# Patient Record
Sex: Female | Born: 2000
Health system: Southern US, Community
[De-identification: ages and names within clinical notes are randomized; demographics above are authoritative.]

## PROBLEM LIST (undated history)

## (undated) DIAGNOSIS — D229 Melanocytic nevi, unspecified: Secondary | ICD-10-CM

---
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
## 2021-01-05 ENCOUNTER — Encounter: Payer: Self-pay | Admitting: Family Medicine

## 2021-01-06 ENCOUNTER — Telehealth: Payer: Self-pay | Admitting: Family Medicine

## 2021-01-06 NOTE — Telephone Encounter (Signed)
I already called, yes

## 2021-01-06 NOTE — Telephone Encounter (Signed)
Spoke with patient and clarified that there is no issue of putting IUD in during her cycle, in fact they used to put them in only during cycles in the past

## 2021-01-07 ENCOUNTER — Other Ambulatory Visit: Payer: Self-pay

## 2021-01-07 ENCOUNTER — Encounter: Payer: Self-pay | Admitting: Family Medicine

## 2021-01-07 ENCOUNTER — Ambulatory Visit (INDEPENDENT_AMBULATORY_CARE_PROVIDER_SITE_OTHER): Payer: No Typology Code available for payment source | Admitting: Family Medicine

## 2021-01-07 VITALS — BP 128/80 | HR 88 | Ht 62.5 in | Wt 216.0 lb

## 2021-01-07 DIAGNOSIS — Z30430 Encounter for insertion of intrauterine contraceptive device: Secondary | ICD-10-CM | POA: Diagnosis not present

## 2021-01-07 LAB — PREGNANCY, URINE: Preg Test, Ur: NEGATIVE

## 2021-01-07 MED ORDER — PARAGARD INTRAUTERINE COPPER IU IUD: 1.0000 | INTRAUTERINE_SYSTEM | Freq: Once | INTRAUTERINE | Status: AC

## 2021-01-07 NOTE — Progress Notes (Signed)
BP 128/80   Pulse 88   Ht 5' 2.5" (1.588 m)   Wt 216 lb (98 kg)   SpO2 100%   BMI 38.88 kg/m    Subjective:   Patient ID: Donna Walton, female    DOB: [birth year], 20 y.o.   MRN: [id number]  HPI: Donna Walton is a 20 y.o. female presenting on 01/07/2021 for contraceptive management and Abdominal Pain   HPI Birth control counseling and IUD.  Patient comes in today and she wants nonhormonal IUD, explained positive benefits and the possible side effects of the ParaGard and she would like to still go ahead with that.  Relevant past medical, surgical, family and social history reviewed and updated as indicated. Interim medical history since our last visit reviewed. Allergies and medications reviewed and updated.  Review of Systems  Constitutional:  Negative for chills and fever.  Eyes:  Negative for visual disturbance.  Respiratory:  Negative for chest tightness and shortness of breath.   Cardiovascular:  Negative for chest pain and leg swelling.  Genitourinary:  Negative for menstrual problem.  Psychiatric/Behavioral:  Negative for agitation and behavioral problems.   All other systems reviewed and are negative.  Per HPI unless specifically indicated above   Allergies as of 01/07/2021   No Known Allergies      Medication List        Accurate as of January 07, 2021  9:33 AM. If you have any questions, ask your nurse or doctor.          cefdinir 300 MG capsule Commonly known as: OMNICEF Take 1 capsule (300 mg total) by mouth 2 (two) times daily.         Objective:   BP 128/80   Pulse 88   Ht 5' 2.5" (1.588 m)   Wt 216 lb (98 kg)   SpO2 100%   BMI 38.88 kg/m   Wt Readings from Last 3 Encounters:  01/07/21 216 lb (98 kg) (98 %, Z= 2.16)*  12/09/20 210 lb (95.3 kg) (98 %, Z= 2.09)*  09/05/19 190 lb 9.6 oz (86.5 kg) (97 %, Z= 1.85)*   * Growth percentiles are based on CDC (Girls, 2-20 Years) data.    Physical Exam Vitals and nursing note reviewed.  Exam conducted with a chaperone present.  Constitutional:      Appearance: She is well-developed.  Genitourinary:    General: Normal vulva.     Exam position: Lithotomy position.     Labia:        Right: No rash or tenderness.        Left: No rash or tenderness.      Vagina: Normal.     Cervix: Dilated. Cervical bleeding present. No cervical motion tenderness or erythema.     Uterus: Normal.      Adnexa: Right adnexa normal and left adnexa normal.  Neurological:     Mental Status: She is alert.     IUD insertion procedure: Patient was placed in stirrups and use a speculum. Patient was prepped with Betadine swabs using cotton balls. Tenaculum was used to grab the anterior cervix. Uterine sound was performed and found to be 7cm in length and anteverted. Cervical dilation was not necessary as she is on her cycle.  ParaGard was placed using factory device at the correct depth that was measured and deployed without issue. The strings were cut leaving 1.5 cm extra. Patient tolerated procedure well and bleeding was minimal.  Assessment & Plan:   Problem List  Items Addressed This Visit   None Visit Diagnoses     Encounter for insertion of intrauterine contraceptive device (IUD)    -  Primary   Relevant Medications   paragard intrauterine copper IUD 1 each (Start on 01/07/2021  9:45 AM)   Other Relevant Orders   POCT urine pregnancy        Follow up plan: Return if symptoms worsen or fail to improve, for 5-6 -week IUD recheck.  Counseling provided for all of the vaccine components Orders Placed This Encounter  Procedures   POCT urine pregnancy    Caryl Pina, MD [facility] 01/07/2021, 9:33 AM

## 2021-01-07 NOTE — Patient Instructions (Signed)
Intrauterine Copper Contraceptive What is this medication? INTRAUTERINE COPPER (IN truh Kendra Opitz ryn KOP er) prevents pregnancy. It belongs to a group of medications known as contraceptives. This medicine may be used for other purposes; ask your health care provider or pharmacist if you have questions. COMMON BRAND NAME(S): ParaGard T380A What should I tell my care team before I take this medication? They need to know if you have any of these conditions: Abnormal Pap smear Cancer of the breast, uterus, or cervix Endometritis Genital or pelvic infection now or in the past Have more than one sexual partner or your partner has more than one partner History of an ectopic or tubal pregnancy Immune system problems IUD in place Use intravenous drugs Uterus of unusual shape Vaginal bleeding that has not been explained Wilson's disease An unusual or allergic reaction to copper, barium sulfate, or polyethylene, other medications, foods, dyes, or preservatives Pregnant or trying to get pregnant Breast-feeding How should I use this medication? This device is placed inside the uterus by your care team. A patient package insert for the product will be given each time it is inserted. Be sure to read this information carefully each time. The sheet may change often. Talk to your care team about use of this medication in children. Special care may be needed. Overdosage: If you think you have taken too much of this medicine contact a poison control center or emergency room at once. NOTE: This medicine is only for you. Do not share this medicine with others. What if I miss a dose? This does not apply. The device will need to be replaced every 10 years if you wish to continue using this type of birth control. What may interact with this medication? Interactions are not expected. Tell your care team about all the medications you take. This list may not describe all possible interactions. Give your health  care provider a list of all the medicines, herbs, non-prescription drugs, or dietary supplements you use. Also tell them if you smoke, drink alcohol, or use illegal drugs. Some items may interact with your medicine. What should I watch for while using this medication? Visit your care team for regular check-ups. Tell your care team if you or your partner becomes HIV positive or gets a sexually transmitted disease. If you have any reason to think you are pregnant, stop using this medication right away and contact your care team. This medication does not protect you against HIV infection (AIDS) or any other sexually transmitted diseases. You can check the placement of the IUD yourself by reaching up to the top of your vagina with clean fingers to feel the threads. Do not pull on the threads. It is a good habit to check placement after each menstrual period. Call your care team right away if you feel more of the IUD than just the threads or if you cannot feel the threads at all. The IUD may come out by itself. You may become pregnant if the device comes out. If you notice that the IUD has come out use a backup birth control method like condoms and call your care team. Using tampons will not change the position of the IUD and are okay to use during your period. This IUD can be safely scanned with magnetic resonance imaging (MRI) only under specific conditions. Before you have an MRI, tell your care team that you have an IUD in place, and which type of IUD you have in place. What side effects may I  notice from receiving this medication? Side effects that you should report to your care team as soon as possible: Allergic reactions--skin rash, itching, hives, swelling of the face, lips, tongue, or throat Heavy vaginal bleeding Low red blood cell counts--trouble breathing; feeling faint; lightheaded, falls; unusually weak or tired Pain during sex Pelvic inflammatory disease (PID)--fever, abdominal pain, pelvic  pain, pain or trouble passing urine, spotting, bleeding during or after sex Unusual vaginal discharge, itching, or odor Vaginal pain, irritation, or sores Side effects that usually do not require medical attention (report these to your care team if they continue or are bothersome): Back pain Irregular menstrual cycles or spotting Menstrual cramps Muscle cramps, pain This list may not describe all possible side effects. Call your doctor for medical advice about side effects. You may report side effects to FDA at 1-800-FDA-1088. Where should I keep my medication? This does not apply. NOTE: This sheet is a summary. It may not cover all possible information. If you have questions about this medicine, talk to your doctor, pharmacist, or health care provider.  2022 Elsevier/Gold Standard (2020-07-29 00:00:00)

## 2021-02-24 ENCOUNTER — Ambulatory Visit: Payer: No Typology Code available for payment source | Admitting: Family Medicine

## 2021-04-16 ENCOUNTER — Ambulatory Visit (INDEPENDENT_AMBULATORY_CARE_PROVIDER_SITE_OTHER): Payer: No Typology Code available for payment source | Admitting: Family Medicine

## 2021-04-16 VITALS — BP 137/81 | HR 95 | Temp 97.2°F | Ht 62.5 in | Wt 223.4 lb

## 2021-04-16 DIAGNOSIS — Z30431 Encounter for routine checking of intrauterine contraceptive device: Secondary | ICD-10-CM

## 2021-04-16 DIAGNOSIS — R110 Nausea: Secondary | ICD-10-CM

## 2021-04-16 NOTE — Progress Notes (Signed)
Subjective: CC:IUD check PCP: Janora Norlander, DO WCH:ENIDPO Oakey is a 21 y.o. female presenting to clinic today for:  1. IUD check Reports menses are regular and she really likes the IUD. No pelvic pain. Periods slightly heavier.  She reports random waves of nausea that can occur any time of the month. No GERD symptoms. Tried cleaning up her diet but no real change.   ROS: Per HPI  No Known Allergies Past Medical History:  Diagnosis Date   Atypical nevus 05/25/2004   Top Scalp-Slight (Dr. Waldon Merl)   Atypical nevus 09/29/2008   Mid Vertex of Scalp-Moderate (Dr. Waldon Merl)   Atypical nevus 09/20/2012   Left Scalp-Moderate   Atypical nevus-Melanocytic with unusual features 05/25/2004   Left Buttocks, Perirectal (Exc) (Dr. Waldon Merl)    Current Outpatient Medications:    cefdinir (OMNICEF) 300 MG capsule, Take 1 capsule (300 mg total) by mouth 2 (two) times daily., Disp: 14 capsule, Rfl: 0  Current Facility-Administered Medications:    paragard intrauterine copper IUD 1 each, 1 each, Intrauterine, Once, Dettinger, Fransisca Kaufmann, MD Social History   Socioeconomic History   Marital status: Single    Spouse name: Not on file   Number of children: Not on file   Years of education: Not on file   Highest education level: Not on file  Occupational History   Not on file  Tobacco Use   Smoking status: Never   Smokeless tobacco: Never  Vaping Use   Vaping Use: Never used  Substance and Sexual Activity   Alcohol use: No   Drug use: No   Sexual activity: Never  Other Topics Concern   Not on file  Social History Narrative   Not on file   Social Determinants of Health   Financial Resource Strain: Not on file  Food Insecurity: Not on file  Transportation Needs: Not on file  Physical Activity: Not on file  Stress: Not on file  Social Connections: Not on file  Intimate Partner Violence: Not on file   Family History  Problem Relation Age of Onset   Hypertension Father     Hyperlipidemia Father    Bladder Cancer Father    Hypertension Maternal Grandmother    Hypertension Maternal Grandfather    Hyperlipidemia Maternal Grandfather    Diabetes Paternal Grandmother    CAD Paternal Grandmother    Uterine cancer Paternal Grandmother    Heart attack Paternal Grandfather     Objective: Office vital signs reviewed. BP 137/81    Pulse 95    Temp (!) 97.2 F (36.2 C)    Ht 5' 2.5" (1.588 m)    Wt 223 lb 6.4 oz (101.3 kg)    LMP 03/16/2021    SpO2 99%    BMI 40.21 kg/m   Physical Examination:  General: Awake, alert, obese, well nourished, No acute distress HEENT: sclera white, MMM GU: external vaginal tissue normal, cervix high and retroverted, no punctate lesions on cervix appreciated, mild clear/ beige discharge from cervical os, no bleeding, no cervical motion tenderness, no abdominal/ adnexal masses  Assessment/ Plan: 21 y.o. female   IUD check up  Nausea  Strings visible. Mild discharge but seems physiologic. Asymptomatic. Approaching menses.  Uncertain etiology of nausea.  Still having menstrual cycles regularly so doubt pregnancy.  ?GERD. Asked her to keep a diary.  This is not in the literature as a side effect of paragard but will cc Dr Dettinger for any additional input.  No orders of the defined types were  placed in this encounter.  No orders of the defined types were placed in this encounter.    Janora Norlander, DO [place] [phone]

## 2021-08-02 ENCOUNTER — Ambulatory Visit (INDEPENDENT_AMBULATORY_CARE_PROVIDER_SITE_OTHER): Payer: No Typology Code available for payment source | Admitting: Family Medicine

## 2021-08-02 ENCOUNTER — Encounter: Payer: Self-pay | Admitting: Family Medicine

## 2021-08-02 DIAGNOSIS — I10 Essential (primary) hypertension: Secondary | ICD-10-CM | POA: Diagnosis not present

## 2021-08-02 MED ORDER — SAXENDA 18 MG/3ML ~~LOC~~ SOPN
PEN_INJECTOR | SUBCUTANEOUS | 3 refills | Status: DC
  Filled 2021-08-04: qty 15, 30d supply, fill #0
  Filled 2021-08-30: qty 15, 30d supply, fill #1
  Filled 2021-10-05: qty 15, 30d supply, fill #2

## 2021-08-02 MED ORDER — NOVOFINE PLUS PEN NEEDLE 32G X 4 MM MISC
3 refills | Status: DC
  Filled 2021-08-04: qty 100, 90d supply, fill #0

## 2021-08-02 NOTE — Progress Notes (Signed)
Subjective: CC: Initial weight management appointment HPI: Donna Walton is a 21 y.o. female that presents today for initial weight loss visit.  They note that obesity does run in the family.  Maximum weight: 230.  Patient wishes to achieve the goal by: weight loss medication, maybe phentermine.  Medications tried in the past: none.  Side effects: n/a.  Diet is tried in the past: intermittent fasting, reduced caloric intake and increased physical exercise but she is not currently exercising nor following a strict diet just low in bread and zero sugar sodas.  Patient has never seen a dietitian in the past.  Patient's occupation: Corporate treasurer.  Denies Apnea, frequent nighttime awakenings, poor energy, snoring.   Possible weight altering medications: Negative for Atypical antipsychotics/ antidepressents, oral corticosteroids, Depo-Provera, insulin  Past medical history Negative for thyroid disease (including thyroid cancer), MEN, pancreatitis, diabetes, high blood pressure, high cholesterol, heart attack, stroke. Negative for Mental health disorders including addiction, depression, anxiety, bipolar disorder, OCD.  Family history Negative for thyroid disease (including thyroid cancer), MEN, pancreatitis Positive for diabetes, high blood pressure, high cholesterol   Past Medical History:  Diagnosis Date   Atypical nevus 05/25/2004   Top Scalp-Slight (Dr. Waldon Merl)   Atypical nevus 09/29/2008   Mid Vertex of Scalp-Moderate (Dr. Waldon Merl)   Atypical nevus 09/20/2012   Left Scalp-Moderate   Atypical nevus-Melanocytic with unusual features 05/25/2004   Left Buttocks, Perirectal (Exc) (Dr. Waldon Merl)   No current outpatient medications on file.  Current Facility-Administered Medications:    paragard intrauterine copper IUD 1 each, 1 each, Intrauterine, Once, Dettinger, Fransisca Kaufmann, MD Social History   Socioeconomic History   Marital status: Single    Spouse name: Not on file   Number of  children: Not on file   Years of education: Not on file   Highest education level: Not on file  Occupational History   Not on file  Tobacco Use   Smoking status: Never   Smokeless tobacco: Never  Vaping Use   Vaping Use: Never used  Substance and Sexual Activity   Alcohol use: No   Drug use: No   Sexual activity: Never  Other Topics Concern   Not on file  Social History Narrative   Not on file   Social Determinants of Health   Financial Resource Strain: Not on file  Food Insecurity: Not on file  Transportation Needs: Not on file  Physical Activity: Not on file  Stress: Not on file  Social Connections: Not on file  Intimate Partner Violence: Not on file   No Known Allergies Family History  Problem Relation Age of Onset   Hypertension Father    Hyperlipidemia Father    Bladder Cancer Father    Hypertension Maternal Grandmother    Hypertension Maternal Grandfather    Hyperlipidemia Maternal Grandfather    Diabetes Paternal Grandmother    CAD Paternal Grandmother    Uterine cancer Paternal Grandmother    Heart attack Paternal Grandfather     ROS: Per HPI  Objective: Office vital signs reviewed. BP 138/80   Pulse 77   Temp 97.6 F (36.4 C)   Ht '5\' 2"'$  (1.575 m)   Wt 222 lb 3.2 oz (100.8 kg)   LMP 07/26/2021   SpO2 100%   BMI 40.64 kg/m   Physical Examination:  General: Awake, alert, morbidly obese, well-appearing female, No acute distress HEENT: Normal    Neck: No masses palpated.  No thyromegaly    Eyes: PERRLA, extraocular movement in tact,  sclera white; no exophthalmos     Throat: moist mucus membranes, Airway is patent Cardio: regular rate and rhythm, S1S2 heard, no murmurs appreciated Pulm: clear to auscultation bilaterally, no wheezes, rhonchi or rales; normal work of breathing on room air Extremities: warm, well perfused, No edema, cyanosis or clubbing; +2 pulses bilaterally MSK: normal gait and station Skin: dry; intact; no rashes or lesions; NO  acanthosis nigricans observed Psych: Mood stable, speech normal, affect appropriate, good eye contact     08/02/2021   10:06 AM 04/16/2021    3:47 PM 01/07/2021    9:05 AM  Depression screen PHQ 2/9  Decreased Interest 0 0 0  Down, Depressed, Hopeless 0 0 0  PHQ - 2 Score 0 0 0  Altered sleeping   0  Tired, decreased energy   0  Change in appetite   0  Feeling bad or failure about yourself    0  Trouble concentrating   0  Moving slowly or fidgety/restless   0  Suicidal thoughts   0  PHQ-9 Score   0       08/02/2021   10:06 AM 04/16/2021    3:47 PM 01/07/2021    9:05 AM 12/09/2020   10:13 AM  GAD 7 : Generalized Anxiety Score  Nervous, Anxious, on Edge 0 0 1 1  Control/stop worrying 0 0 0 0  Worry too much - different things 0 0 0 0  Trouble relaxing 0 0 0 0  Restless 0 0 0 0  Easily annoyed or irritable 0 0 1 1  Afraid - awful might happen 0 0 0 0  Total GAD 7 Score 0 0 2 2  Anxiety Difficulty Not difficult at all Not difficult at all  Not difficult at all   Assessment/ Plan: 21 y.o. femalehere for initial weight management visit.  Morbid obesity ([place]) - Plan: Liraglutide -Weight Management (SAXENDA) 18 MG/3ML SOPN, Insulin Pen Needle (NOVOFINE PLUS PEN NEEDLE) 32G X 4 MM MISC  Essential hypertension - Plan: Liraglutide -Weight Management (SAXENDA) 18 MG/3ML SOPN, Insulin Pen Needle (NOVOFINE PLUS PEN NEEDLE) 32G X 4 MM MISC  BMI greater than 40 with multiple risk factors for chronic disease associated with morbid obesity.  Her blood pressure today is consistent with a diet-controlled hypertension.  I have recommended GLP for treatment of obesity.  We discussed potential side effects and risks of medication but there are no apparent contraindications to use.  Plan is to get her up to 3 mg and then transition her over to 1.7 mg of the [facility] weekly.  She is open to dietitian referral.  We may consider placing this.  I would like to see her back in 3 months for weight  recheck  [place], [place] [phone]

## 2021-08-02 NOTE — Patient Instructions (Signed)
Tips for success with Saxenda (and by success, how not to be super sick on your stomach): Eat small meals AVOID heavy foods (fried/ high in carbs like bread, pasta, rice) AVOID carbonated beverages (soda/ beer, as these can increase bloating) DOUBLE your water intake (will help you avoid constipation/ dehydration)  Saxenda CAN cause: Nausea Abdominal pain Increased acid reflux (sometimes presents as "sour burps") Constipation OR Diarrhea Fatigue (especially when you first start it)

## 2021-08-03 ENCOUNTER — Other Ambulatory Visit (HOSPITAL_COMMUNITY): Payer: Self-pay

## 2021-08-04 ENCOUNTER — Other Ambulatory Visit (HOSPITAL_COMMUNITY): Payer: Self-pay

## 2021-08-04 ENCOUNTER — Telehealth: Payer: Self-pay | Admitting: *Deleted

## 2021-08-04 NOTE — Telephone Encounter (Signed)
Donna Walton (Donna Walton) Rx #: [id number] Saxenda '18MG'$ Fayne Mediate pen-injectors  Sent to plan

## 2021-08-04 NOTE — Telephone Encounter (Signed)
Your prior authorization for Donna Walton has been approved! MORE INFO For eligible patients, copay assistance may be available. To learn more and be redirected to the [facility] website, click on the "More Info" button to the right. Please also note that you may need to schedule a follow-up visit with your patient prior to the expiration of this prior authorization, as updated patient weight may be required for reauthorization.  Message from plan: The request has been approved. The authorization is effective for a maximum of 4 fills from 08/04/2021 to 12/02/2021, as long as the member is enrolled in their current health plan. The request was approved as submitted. This request has been approved for 35m per 30 days.Renewal for Saxenda requires that the patient has achieved or maintained at least 4 percent weight loss of baseline body weight after 4 months of treatment.Please note: This medication must be filled by designated specialty pharmacy. Call [phone] additional information. A written notification letter will follow with additional details.   Pharm aware -[facility]

## 2021-08-05 ENCOUNTER — Other Ambulatory Visit (HOSPITAL_COMMUNITY): Payer: Self-pay

## 2021-08-30 ENCOUNTER — Other Ambulatory Visit (HOSPITAL_COMMUNITY): Payer: Self-pay

## 2021-08-31 ENCOUNTER — Ambulatory Visit (INDEPENDENT_AMBULATORY_CARE_PROVIDER_SITE_OTHER): Payer: No Typology Code available for payment source | Admitting: Physician Assistant

## 2021-08-31 ENCOUNTER — Encounter: Payer: Self-pay | Admitting: Physician Assistant

## 2021-08-31 DIAGNOSIS — Z1283 Encounter for screening for malignant neoplasm of skin: Secondary | ICD-10-CM | POA: Diagnosis not present

## 2021-08-31 DIAGNOSIS — Z86018 Personal history of other benign neoplasm: Secondary | ICD-10-CM | POA: Diagnosis not present

## 2021-08-31 NOTE — Progress Notes (Signed)
   Follow-Up Visit   Subjective  Donna Walton is a 21 y.o. female who presents for the following: Annual Exam (Full body skin exam with personal history of atypical moles slight to moderate.).   The following portions of the chart were reviewed this encounter and updated as appropriate:  Tobacco  Allergies  Meds  Problems  Med Hx  Surg Hx  Fam Hx      Objective  Well appearing patient in no apparent distress; mood and affect are within normal limits.  A full examination was performed including scalp, head, eyes, ears, nose, lips, neck, chest, axillae, abdomen, back, buttocks, bilateral upper extremities, bilateral lower extremities, hands, feet, fingers, toes, fingernails, and toenails. All findings within normal limits unless otherwise noted below.  No atypical nevi or signs of NMSC noted at the time of the visit.    Assessment & Plan  Screening exam for skin cancer  Yearly skin checks due to history of atypia.    I, Shammara Jarrett, PA-C, have reviewed all documentation's for this visit.  The documentation on 08/31/21 for the exam, diagnosis, procedures and orders are all accurate and complete.

## 2021-09-29 ENCOUNTER — Telehealth: Payer: Self-pay | Admitting: Family Medicine

## 2021-09-29 NOTE — Telephone Encounter (Signed)
Ok to schedule video visit for this med if needed but will need an appt for a controlled substance.  Med can only be given for 1 month so will not be a reliable antiobesity med long term.  FYI

## 2021-09-29 NOTE — Telephone Encounter (Signed)
lmtcb

## 2021-09-30 NOTE — Telephone Encounter (Signed)
PT R/C 

## 2021-10-01 ENCOUNTER — Encounter: Payer: Self-pay | Admitting: Family Medicine

## 2021-10-01 ENCOUNTER — Other Ambulatory Visit: Payer: Self-pay | Admitting: Family Medicine

## 2021-10-01 DIAGNOSIS — Z111 Encounter for screening for respiratory tuberculosis: Secondary | ICD-10-CM

## 2021-10-01 NOTE — Telephone Encounter (Signed)
Pt scheduled for video

## 2021-10-01 NOTE — Progress Notes (Unsigned)
Orders Placed This Encounter  Procedures   QuantiFERON-TB Gold Plus    Standing Status:   Future    Standing Expiration Date:   10/02/2022

## 2021-10-04 ENCOUNTER — Ambulatory Visit (INDEPENDENT_AMBULATORY_CARE_PROVIDER_SITE_OTHER): Payer: No Typology Code available for payment source

## 2021-10-04 DIAGNOSIS — Z111 Encounter for screening for respiratory tuberculosis: Secondary | ICD-10-CM

## 2021-10-04 NOTE — Progress Notes (Signed)
TB skin test placed to left forearm, patient tolerated well.  Appointment scheduled on 10/06/21 for patient to return to have test read.

## 2021-10-06 ENCOUNTER — Other Ambulatory Visit (HOSPITAL_COMMUNITY): Payer: Self-pay

## 2021-10-06 ENCOUNTER — Ambulatory Visit: Payer: No Typology Code available for payment source | Admitting: *Deleted

## 2021-10-06 DIAGNOSIS — Z111 Encounter for screening for respiratory tuberculosis: Secondary | ICD-10-CM

## 2021-10-06 LAB — TB SKIN TEST
Induration: 0 mm
TB Skin Test: NEGATIVE

## 2021-10-06 NOTE — Progress Notes (Signed)
PPD skin test negative.

## 2021-10-08 ENCOUNTER — Telehealth (INDEPENDENT_AMBULATORY_CARE_PROVIDER_SITE_OTHER): Payer: No Typology Code available for payment source | Admitting: Family Medicine

## 2021-10-08 ENCOUNTER — Encounter: Payer: Self-pay | Admitting: Family Medicine

## 2021-10-08 DIAGNOSIS — Z6837 Body mass index (BMI) 37.0-37.9, adult: Secondary | ICD-10-CM

## 2021-10-08 DIAGNOSIS — E6609 Other obesity due to excess calories: Secondary | ICD-10-CM | POA: Diagnosis not present

## 2021-10-08 MED ORDER — PHENTERMINE HCL 37.5 MG PO TABS: 18.7500 mg | ORAL_TABLET | Freq: Every day | ORAL | 0 refills | Status: DC

## 2021-10-08 NOTE — Progress Notes (Signed)
MyChart Video visit  Subjective: CC: Follow-up obesity PCP: Janora Norlander, DO MWN:UUVOZD Matusek is a 21 y.o. female. Patient provides verbal consent for consult held via video.  Due to COVID-19 pandemic this visit was conducted virtually. This visit type was conducted due to national recommendations for restrictions regarding the COVID-19 Pandemic (e.g. social distancing, sheltering in place) in an effort to limit this patient's exposure and mitigate transmission in our community. All issues noted in this document were discussed and addressed.  A physical exam was not performed with this format.   Location of patient: home Location of provider: [facility] Others present for call: none  1. Morbid obesity Patient reports that she could not tolerate the Saxenda.  She started developing nausea, vomiting and explosive diarrhea after she escalated dose.  She does not feel that she is consuming too much food nor any excessive amounts of carbohydrates that might be contributing.  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  She would like to go to phentermine to see if this may help with weight.  Patient's last menstrual period was 10/06/2021.    ROS: Per HPI  No Known Allergies Past Medical History:  Diagnosis Date   Atypical nevus 05/25/2004   Top Scalp-Slight (Dr. Waldon Merl)   Atypical nevus 09/29/2008   Mid Vertex of Scalp-Moderate (Dr. Waldon Merl)   Atypical nevus 09/20/2012   Left Scalp-Moderate   Atypical nevus-Melanocytic with unusual features 05/25/2004   Left Buttocks, Perirectal (Exc) (Dr. Waldon Merl)    Current Outpatient Medications:    Insulin Pen Needle (NOVOFINE PLUS PEN NEEDLE) 32G X 4 MM MISC, UAD with saxenda, Disp: 100 each, Rfl: 3   Liraglutide -Weight Management (SAXENDA) 18 MG/3ML SOPN, Inject 0.6 mg into the skin daily for 7 days, THEN 1.2 mg daily for 7 days, THEN 1.8 mg daily for 7 days, THEN 2.4 mg  daily for 7 days, THEN 3 mg daily., Disp: 45 mL, Rfl: 3  Current Facility-Administered Medications:    paragard intrauterine copper IUD 1 each, 1 each, Intrauterine, Once, Dettinger, Fransisca Kaufmann, MD  Assessment/ Plan: 21 y.o. female   Class 2 obesity due to excess calories without serious comorbidity with body mass index (BMI) of 37.0 to 37.9 in adult - Plan: phentermine (ADIPEX-P) 37.5 MG tablet  BMI 37.0-37.9, adult - Plan: phentermine (ADIPEX-P) 37.5 MG tablet  Unfortunately not tolerating the Saxenda.  Uncertain if this is due to volume of food of consumption but regardless wants to transition over to phentermine.  We discussed limitations of this medication, potential side effects.  Encourage p.o. hydration and taper as directed.  Once she has completed this medication we discussed that perhaps she could trial her Wegovy though I cannot guarantee that she will not have similar side effects.  She will follow-up with me in the next few months if she desires to transition over to that type of medication  Start time: 12:09a End time: 12:14pm  Total time spent on patient care (including video visit/ documentation): 5 minutes  [place], [place] [phone]

## 2021-10-13 ENCOUNTER — Telehealth: Payer: No Typology Code available for payment source

## 2021-10-20 IMAGING — US US PELVIS COMPLETE
1 series · 14 of 25 positions shown · non-contrast
Comparison: None

CLINICAL DATA: BILATERAL abdominal pain worse during menstrual
period for 2-3 months

EXAM:
TRANSABDOMINAL ULTRASOUND OF PELVIS
TECHNIQUE: Transabdominal ultrasound examination of the pelvis was performed
including evaluation of the uterus, ovaries, adnexal regions, and
pelvic cul-de-sac. Transvaginal imaging was not performed, patient
denies having been sexually active.

[Series 1: us pelvis (transabdominal only) · 14 of 50 slices shown]
[im 1/50]
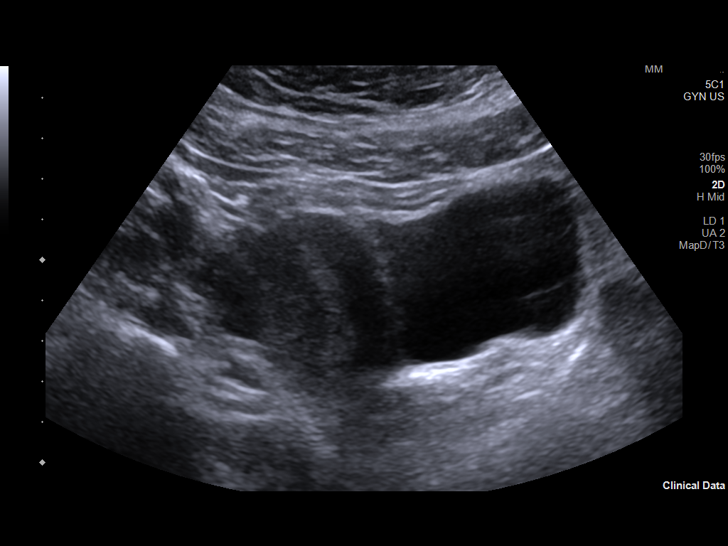
[im 5/50]
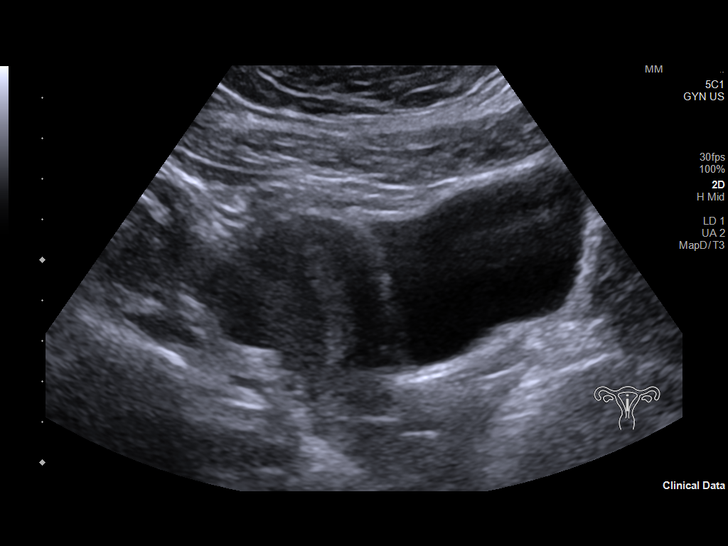
[im 9/50]
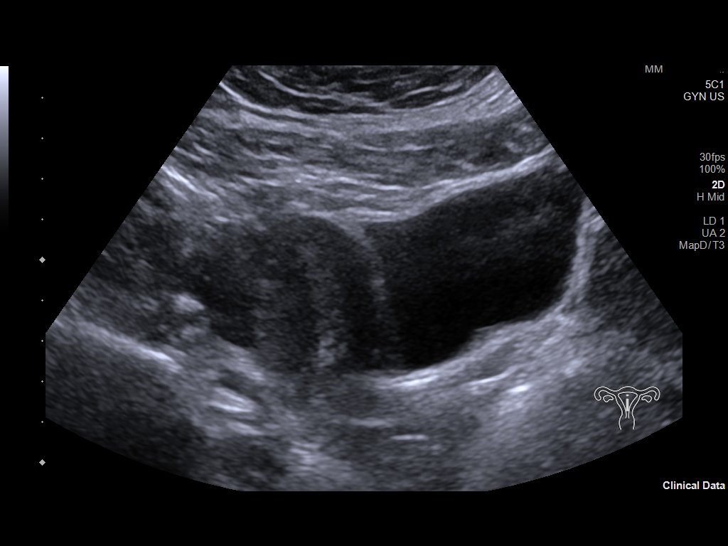
[im 13/50]
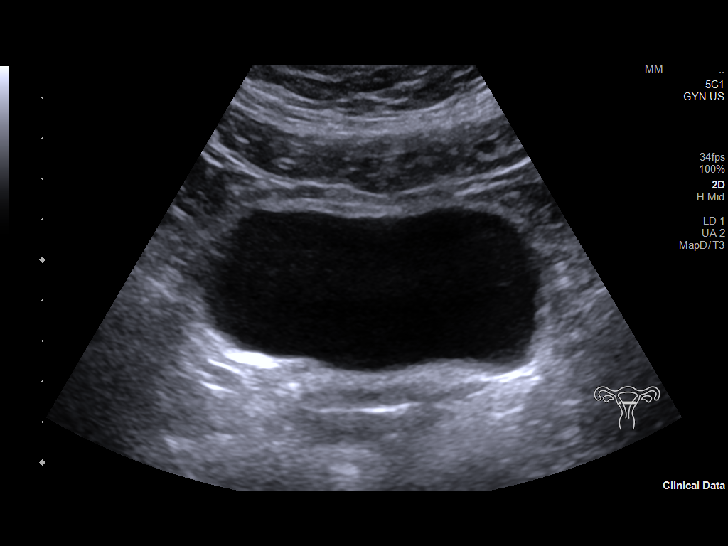
[im 17/50]
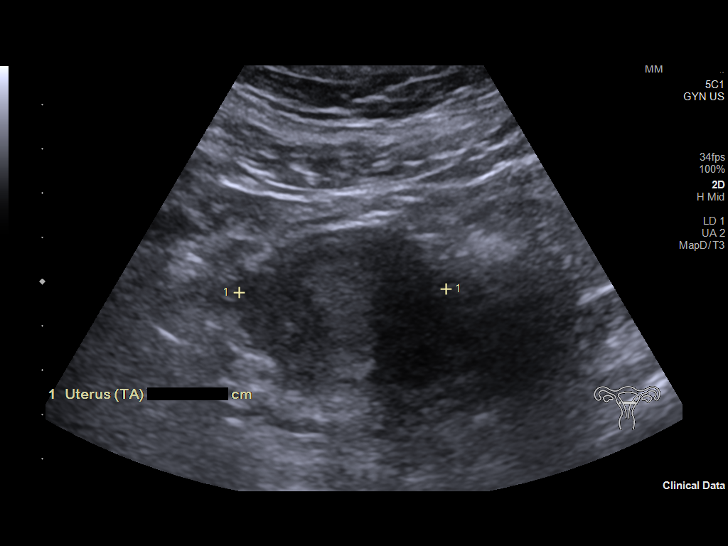
[im 19/50]
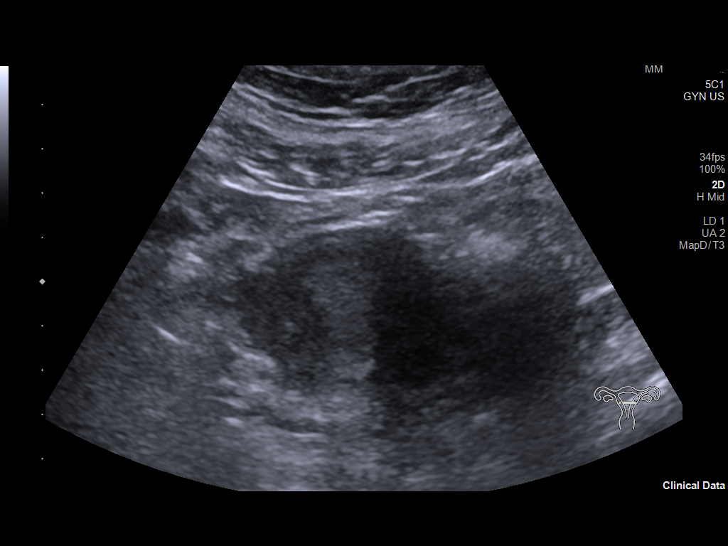
[im 23/50]
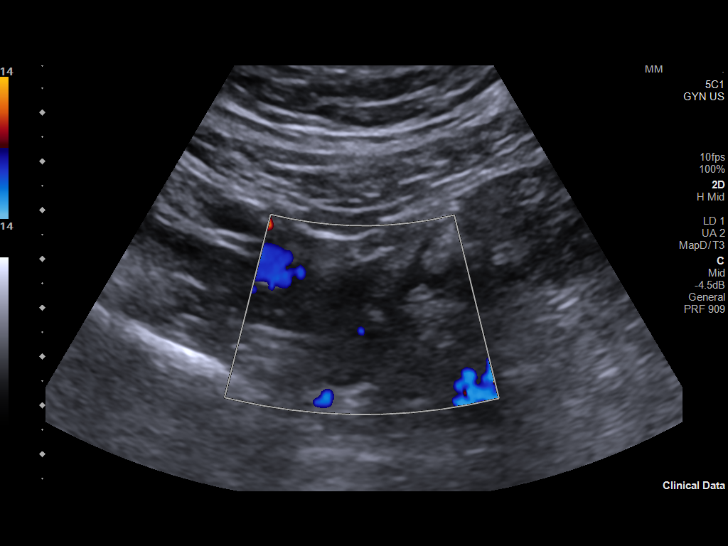
[im 27/50]
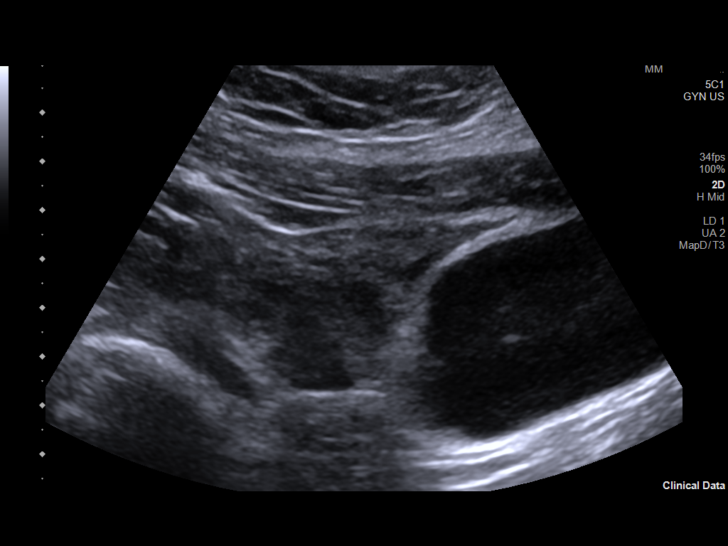
[im 31/50]
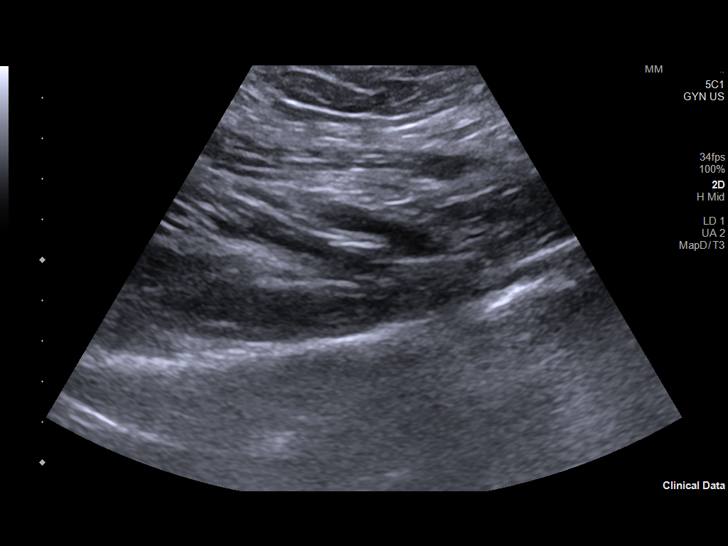
[im 33/50]
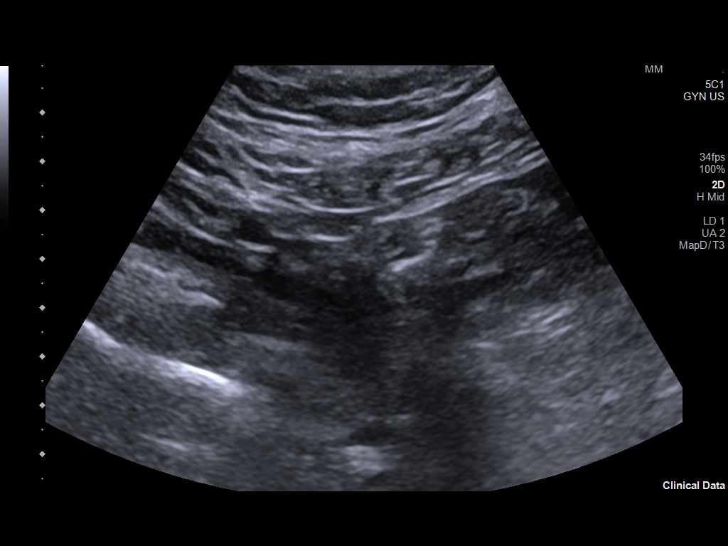
[im 37/50]
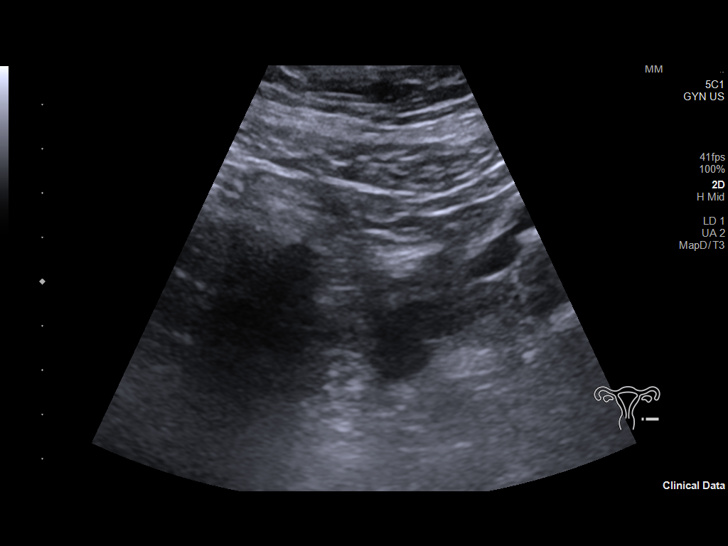
[im 41/50]
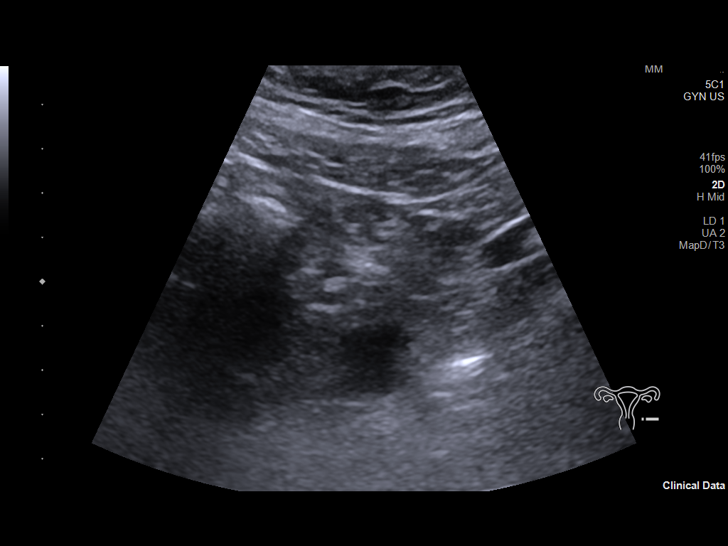
[im 45/50]
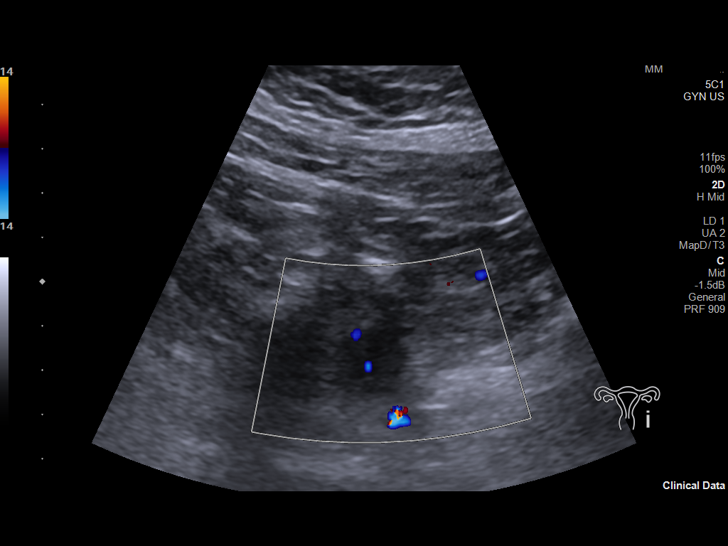
[im 50/50]
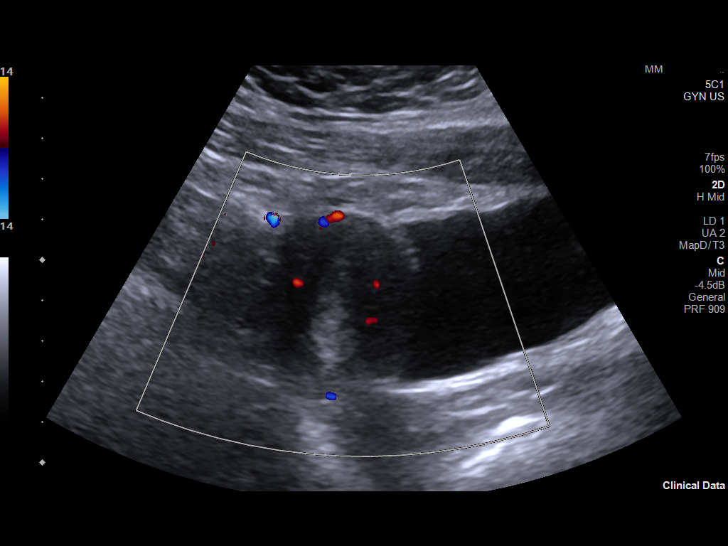

[14 of 25 positions shown; findings below may reference images not displayed]

FINDINGS: Uterus

Measurements: 6.2 x 3.3 x 4.7 cm = volume: 50 mL. Anteverted. Normal
morphology without mass

Endometrium

Thickness: 6 mm, normal.  No endometrial fluid or focal abnormality

Right ovary

Measurements: 2.0 x 1.5 x 1.6 cm = volume: 2.6 mL. Normal morphology
without mass

Left ovary

Measurements: 2.1 x 1.6 x 1.8 cm = volume: 3.2 mL. Normal morphology
without mass

Other findings:  No free pelvic fluid.  No adnexal masses.
IMPRESSION: Normal exam.

## 2021-11-02 ENCOUNTER — Ambulatory Visit: Payer: No Typology Code available for payment source | Admitting: Family Medicine

## 2021-11-22 ENCOUNTER — Telehealth: Payer: Self-pay

## 2021-11-22 NOTE — Telephone Encounter (Signed)
Donna Walton (Key: Laurin Coder) Kirke Shaggy '18MG'$ Fayne Mediate pen-injectors   Form MedImpact ePA Form 2017 NCPDP Created 2 days ago Sent to Plan 3 minutes ago Plan Response 3 minutes ago Submit Clinical Questions less than a minute ago Determination Wait for Determination Please wait for MedImpact 2017 to return a determination.

## 2021-12-10 ENCOUNTER — Encounter: Payer: No Typology Code available for payment source | Admitting: Family Medicine

## 2021-12-24 NOTE — Telephone Encounter (Signed)
Pt couldn't tolerate Saxenda so she was switched to phentermine per Dr Darnell Level last OV note on 10/08/21. Will remove Saxenda from pt's med list and close this encounter.

## 2022-03-04 ENCOUNTER — Encounter: Payer: Self-pay | Admitting: Family Medicine

## 2022-03-04 ENCOUNTER — Ambulatory Visit: Payer: 59 | Admitting: Family Medicine

## 2022-03-04 DIAGNOSIS — Z713 Dietary counseling and surveillance: Secondary | ICD-10-CM | POA: Diagnosis not present

## 2022-03-04 LAB — LIPID PANEL

## 2022-03-04 NOTE — Patient Instructions (Signed)
Here is a guide to help [place] find out which weight loss medications will be covered by your insurance plan.  Please check out this web site  [email or web address] and follow the 3 simple steps.   Wegovy or Zepbound  There is also a phone number you can call if you do not have access to the Internet. [phone] (Monday- Friday 8am-8pm)  [employer] provides coverage information for more than 80% of the inquiries submitted!!

## 2022-03-04 NOTE — Progress Notes (Signed)
Subjective:  Patient ID: Donna Walton, female    DOB: Jul 13, 2000, 22 y.o.   MRN: [id number]  Patient Care Team: Janora Norlander, DO as PCP - General (Family Medicine) Starlyn Skeans as Physician Assistant (Dermatology)   Chief Complaint:  Weight Loss   HPI: Donna Walton is a 22 y.o. female presenting on 03/04/2022 for Weight Loss   Pt presents today to discuss weight loss options. 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 She tries to stay active and follow a healthy diet, can not do this all the time due to her busy schedule. She was on phentermine in the past and could not tolerate the elevated heart rate and anxiety. She was then placed on Saxenda and did very well with this until she got to higher dosing. The higher dosing make her nauseated with some vomiting. She stopped the medication. She did drop 25 lbs while on the Saxenda. She is interested in trying other options available for weight management. No personal or family history of medullary thyroid cancer or pancreatic cancer.     Relevant past medical, surgical, family, and social history reviewed and updated as indicated.  Allergies and medications reviewed and updated. Data reviewed: Chart in Epic.   Past Medical History:  Diagnosis Date   Atypical nevus 05/25/2004   Top Scalp-Slight (Dr. Waldon Merl)   Atypical nevus 09/29/2008   Mid Vertex of Scalp-Moderate (Dr. Waldon Merl)   Atypical nevus 09/20/2012   Left Scalp-Moderate   Atypical nevus-Melanocytic with unusual features 05/25/2004   Left Buttocks, Perirectal (Exc) (Dr. Waldon Merl)    Past Surgical History:  Procedure Laterality Date   TYMPANOSTOMY TUBE PLACEMENT Bilateral 2005   TYMPANOSTOMY TUBE PLACEMENT Bilateral 2004    Social History   Socioeconomic History   Marital status: Single    Spouse name: Not on file   Number of children: Not on file   Years of education: Not on file   Highest education level: Not on file  Occupational History    Not on file  Tobacco Use   Smoking status: Never   Smokeless tobacco: Never  Vaping Use   Vaping Use: Never used  Substance and Sexual Activity   Alcohol use: No   Drug use: No   Sexual activity: Never  Other Topics Concern   Not on file  Social History Narrative   Not on file   Social Determinants of Health   Financial Resource Strain: Not on file  Food Insecurity: Not on file  Transportation Needs: Not on file  Physical Activity: Not on file  Stress: Not on file  Social Connections: Not on file  Intimate Partner Violence: Not on file    Outpatient Encounter Medications as of 03/04/2022  Medication Sig   [DISCONTINUED] phentermine (ADIPEX-P) 37.5 MG tablet Take 0.5-1 tablets (18.75-37.5 mg total) by mouth daily before breakfast. X1 month.  Then take 1/2 tablet daily x2 weeks. Then 1/2 tablet every other day until gone.   Facility-Administered Encounter Medications as of 03/04/2022  Medication   paragard intrauterine copper IUD 1 each    No Known Allergies  Review of Systems  Constitutional:  Negative for activity change, appetite change, chills, diaphoresis, fatigue, fever and unexpected weight change.  HENT: Negative.    Eyes: Negative.   Respiratory:  Negative for cough, chest tightness and shortness of breath.   Cardiovascular:  Negative for chest pain, palpitations and leg swelling.  Gastrointestinal:  Negative for blood in stool, constipation, diarrhea, nausea and vomiting.  Endocrine: Negative.   Genitourinary:  Negative for dysuria, frequency and urgency.  Musculoskeletal:  Negative for arthralgias and myalgias.  Skin: Negative.   Allergic/Immunologic: Negative.   Neurological:  Negative for dizziness and headaches.  Hematological: Negative.   Psychiatric/Behavioral:  Negative for confusion, hallucinations, sleep disturbance and suicidal ideas.   All other systems reviewed and are negative.       Objective:  BP 133/79   Pulse (!) 104   Temp 97.9 F  (36.6 C) (Temporal)   Ht '5\' 2"'$  (1.575 m)   Wt 223 lb 9.6 oz (101.4 kg)   LMP 02/18/2022   SpO2 98%   BMI 40.90 kg/m    Wt Readings from Last 3 Encounters:  03/04/22 223 lb 9.6 oz (101.4 kg)  08/02/21 222 lb 3.2 oz (100.8 kg) (99 %, Z= 2.24)*  04/16/21 223 lb 6.4 oz (101.3 kg) (99 %, Z= 2.25)*   * Growth percentiles are based on CDC (Girls, 2-20 Years) data.    Physical Exam Vitals and nursing note reviewed.  Constitutional:      General: She is not in acute distress.    Appearance: Normal appearance. She is well-developed and well-groomed. She is morbidly obese. She is not ill-appearing, toxic-appearing or diaphoretic.  HENT:     Head: Normocephalic and atraumatic.     Jaw: There is normal jaw occlusion.     Right Ear: Hearing normal.     Left Ear: Hearing normal.     Nose: Nose normal.     Mouth/Throat:     Lips: Pink.     Mouth: Mucous membranes are moist.     Pharynx: Uvula midline.  Eyes:     General: Lids are normal.     Pupils: Pupils are equal, round, and reactive to light.  Neck:     Thyroid: No thyroid mass, thyromegaly or thyroid tenderness.     Vascular: No carotid bruit or JVD.     Trachea: Trachea and phonation normal.  Cardiovascular:     Rate and Rhythm: Normal rate and regular rhythm.     Chest Wall: PMI is not displaced.     Pulses: Normal pulses.     Heart sounds: Normal heart sounds. No murmur heard.    No friction rub. No gallop.  Pulmonary:     Effort: Pulmonary effort is normal. No respiratory distress.     Breath sounds: Normal breath sounds. No wheezing.  Abdominal:     General: Bowel sounds are normal. There is no abdominal bruit.     Palpations: Abdomen is soft. There is no hepatomegaly or splenomegaly.  Musculoskeletal:        General: Normal range of motion.     Cervical back: Normal range of motion and neck supple.     Right lower leg: No edema.     Left lower leg: No edema.  Lymphadenopathy:     Cervical: No cervical adenopathy.   Skin:    General: Skin is warm and dry.     Capillary Refill: Capillary refill takes less than 2 seconds.     Coloration: Skin is not cyanotic, jaundiced or pale.     Findings: No rash.  Neurological:     General: No focal deficit present.     Mental Status: She is alert and oriented to person, place, and time.     Sensory: Sensation is intact.     Motor: Motor function is intact.     Coordination: Coordination is intact.  Gait: Gait is intact.     Deep Tendon Reflexes: Reflexes are normal and symmetric.  Psychiatric:        Attention and Perception: Attention and perception normal.        Mood and Affect: Mood and affect normal.        Speech: Speech normal.        Behavior: Behavior normal. Behavior is cooperative.        Thought Content: Thought content normal.        Cognition and Memory: Cognition and memory normal.        Judgment: Judgment normal.     Results for orders placed or performed in visit on 10/04/21  TB Skin Test  Result Value Ref Range   TB Skin Test Negative    Induration 0 mm       Pertinent labs & imaging results that were available during my care of the patient were reviewed by me and considered in my medical decision making.  Assessment & Plan:  Taron was seen today for weight loss.  Diagnoses and all orders for this visit:  Morbid obesity ([place]) Encounter for weight loss counseling Pt has tried phentermine in the past, could not tolerate side effects. She also tried [place] and did well but has side effects with higher dosing. Has not been on anything in a while. Diet and exercise for weight loss discussed in detail. Pt to keep a log over the next 6 weeks and then return to office for weight check. If covered by insurance, consider starting Wegovy or Zepbound. No family history of MTC or pancreatic cancer.  -     CMP14+EGFR -     Lipid panel -     Thyroid Panel With TSH -     CBC with Differential/Platelet     Continue all other  maintenance medications.  Follow up plan: Return in about 6 weeks (around 04/15/2022), or if symptoms worsen or fail to improve, for BMI.   Continue healthy lifestyle choices, including diet (rich in fruits, vegetables, and lean proteins, and low in salt and simple carbohydrates) and exercise (at least 30 minutes of moderate physical activity daily).  Educational handout given for calorie counting for weight loss.   The above assessment and management plan was discussed with the patient. The patient verbalized understanding of and has agreed to the management plan. Patient is aware to call the clinic if they develop any new symptoms or if symptoms persist or worsen. Patient is aware when to return to the clinic for a follow-up visit. Patient educated on when it is appropriate to go to the emergency department.   Monia Pouch, FNP-C [facility] [phone]

## 2022-03-05 LAB — CBC WITH DIFFERENTIAL/PLATELET
Basophils Absolute: 0 10*3/uL (ref 0.0–0.2)
Basos: 1 %
EOS (ABSOLUTE): 0.1 10*3/uL (ref 0.0–0.4)
Eos: 1 %
Hematocrit: 38 % (ref 34.0–46.6)
Hemoglobin: 12.7 g/dL (ref 11.1–15.9)
Immature Grans (Abs): 0 10*3/uL (ref 0.0–0.1)
Immature Granulocytes: 1 %
Lymphocytes Absolute: 2.3 10*3/uL (ref 0.7–3.1)
Lymphs: 27 %
MCH: 27.6 pg (ref 26.6–33.0)
MCHC: 33.4 g/dL (ref 31.5–35.7)
MCV: 83 fL (ref 79–97)
Monocytes Absolute: 0.5 10*3/uL (ref 0.1–0.9)
Monocytes: 6 %
Neutrophils Absolute: 5.5 10*3/uL (ref 1.4–7.0)
Neutrophils: 64 %
Platelets: 372 10*3/uL (ref 150–450)
RBC: 4.6 x10E6/uL (ref 3.77–5.28)
RDW: 12.6 % (ref 11.7–15.4)
WBC: 8.5 10*3/uL (ref 3.4–10.8)

## 2022-03-05 LAB — CMP14+EGFR
ALT: 17 IU/L (ref 0–32)
AST: 16 IU/L (ref 0–40)
Albumin/Globulin Ratio: 1.8 (ref 1.2–2.2)
Albumin: 4.4 g/dL (ref 4.0–5.0)
Alkaline Phosphatase: 129 IU/L — ABNORMAL HIGH (ref 42–106)
BUN/Creatinine Ratio: 19 (ref 9–23)
BUN: 17 mg/dL (ref 6–20)
Bilirubin Total: 0.2 mg/dL (ref 0.0–1.2)
CO2: 23 mmol/L (ref 20–29)
Calcium: 9.5 mg/dL (ref 8.7–10.2)
Chloride: 103 mmol/L (ref 96–106)
Creatinine, Ser: 0.89 mg/dL (ref 0.57–1.00)
Globulin, Total: 2.4 g/dL (ref 1.5–4.5)
Glucose: 102 mg/dL — ABNORMAL HIGH (ref 70–99)
Potassium: 3.9 mmol/L (ref 3.5–5.2)
Sodium: 143 mmol/L (ref 134–144)
Total Protein: 6.8 g/dL (ref 6.0–8.5)
eGFR: 95 mL/min/{1.73_m2} (ref >59)

## 2022-03-05 LAB — THYROID PANEL WITH TSH
Free Thyroxine Index: 1.8 (ref 1.2–4.9)
T3 Uptake Ratio: 25 % (ref 24–39)
T4, Total: 7.3 ug/dL (ref 4.5–12.0)
TSH: 1.78 u[IU]/mL (ref 0.450–4.500)

## 2022-03-05 LAB — LIPID PANEL
Chol/HDL Ratio: 3.8 ratio (ref 0.0–4.4)
Cholesterol, Total: 167 mg/dL (ref 100–199)
HDL: 44 mg/dL (ref >39)
LDL Chol Calc (NIH): 103 mg/dL — ABNORMAL HIGH (ref 0–99)
Triglycerides: 112 mg/dL (ref 0–149)
VLDL Cholesterol Cal: 20 mg/dL (ref 5–40)

## 2022-03-07 LAB — SPECIMEN STATUS REPORT

## 2022-03-07 LAB — HGB A1C W/O EAG: Hgb A1c MFr Bld: 5.7 % — ABNORMAL HIGH (ref 4.8–5.6)

## 2022-03-15 ENCOUNTER — Ambulatory Visit: Payer: 59 | Admitting: Family Medicine

## 2022-03-18 ENCOUNTER — Encounter: Payer: No Typology Code available for payment source | Admitting: Family Medicine

## 2022-04-20 ENCOUNTER — Encounter: Payer: Self-pay | Admitting: Family Medicine

## 2022-04-22 ENCOUNTER — Ambulatory Visit: Payer: 59 | Admitting: Family Medicine

## 2022-06-01 ENCOUNTER — Telehealth: Payer: Self-pay | Admitting: Family Medicine

## 2022-06-01 NOTE — Telephone Encounter (Signed)
Pt has been put on the sched

## 2022-06-01 NOTE — Telephone Encounter (Signed)
Kelci ok to put her in that afternoon slot if she wants me to take a look for her.  No biggie if she is on her cycle.

## 2022-06-02 ENCOUNTER — Ambulatory Visit (INDEPENDENT_AMBULATORY_CARE_PROVIDER_SITE_OTHER): Payer: 59 | Admitting: Family Medicine

## 2022-06-02 ENCOUNTER — Encounter: Payer: Self-pay | Admitting: Family Medicine

## 2022-06-02 VITALS — BP 115/80 | HR 97 | Temp 98.2°F | Ht 62.0 in | Wt 220.0 lb

## 2022-06-02 DIAGNOSIS — Z30431 Encounter for routine checking of intrauterine contraceptive device: Secondary | ICD-10-CM | POA: Diagnosis not present

## 2022-06-02 NOTE — Progress Notes (Signed)
Subjective: CC: IUD concern PCP: Raliegh Ip, DO XTG:GYIRSW Donna Walton is a 22 y.o. female presenting to clinic today for:  1.  ID concern Patient reports that she typically checks her IUD once per week.  She had her menstrual cycle this past week and notes that she was using tampons.  1 time when she pulled out her tampon she felt like she had a little tug/cramp.  She was able to feel her strings at that point but notes that when she was showering she almost felt like they were on the outside of her vagina.  She has been really keeping a close eye on everything to make sure that the IUD did not fall out and has not visualized the device anywhere but notes that she can no longer feel her IUD strings.  She is here for further evaluation of this   ROS: Per HPI  No Known Allergies Past Medical History:  Diagnosis Date   Atypical nevus 05/25/2004   Top Scalp-Slight (Dr. Wyvonnia Lora)   Atypical nevus 09/29/2008   Mid Vertex of Scalp-Moderate (Dr. Wyvonnia Lora)   Atypical nevus 09/20/2012   Left Scalp-Moderate   Atypical nevus-Melanocytic with unusual features 05/25/2004   Left Buttocks, Perirectal (Exc) (Dr. Wyvonnia Lora)   No current outpatient medications on file.  Current Facility-Administered Medications:    paragard intrauterine copper IUD 1 each, 1 each, Intrauterine, Once, Dettinger, Elige Radon, MD Social History   Socioeconomic History   Marital status: Single    Spouse name: Not on file   Number of children: Not on file   Years of education: Not on file   Highest education level: Not on file  Occupational History   Not on file  Tobacco Use   Smoking status: Never   Smokeless tobacco: Never  Vaping Use   Vaping Use: Never used  Substance and Sexual Activity   Alcohol use: No   Drug use: No   Sexual activity: Never  Other Topics Concern   Not on file  Social History Narrative   Not on file   Social Determinants of Health   Financial Resource Strain: Not on file  Food  Insecurity: Not on file  Transportation Needs: Not on file  Physical Activity: Not on file  Stress: Not on file  Social Connections: Not on file  Intimate Partner Violence: Not on file   Family History  Problem Relation Age of Onset   Hypertension Father    Hyperlipidemia Father    Bladder Cancer Father    Hypertension Maternal Grandmother    Hypertension Maternal Grandfather    Hyperlipidemia Maternal Grandfather    Diabetes Paternal Grandmother    CAD Paternal Grandmother    Uterine cancer Paternal Grandmother    Heart attack Paternal Grandfather     Objective: Office vital signs reviewed. BP 115/80   Pulse 97   Temp 98.2 F (36.8 C)   Ht 5\' 2"  (1.575 m)   Wt 220 lb (99.8 kg)   LMP 06/02/2022 Comment: end date  SpO2 96%   BMI 40.24 kg/m   Physical Examination:  General: Awake, alert, obese, No acute distress GU: external vaginal tissue normal, cervix midline and high, no punctate lesions on cervix appreciated, clear/ yellow discharge from cervical os, no bleeding, no cervical motion tenderness,IUD strings visualized coming from the cervical os.  Assessment/ Plan: 22 y.o. female   IUD check up  IUD appears to be in place.  We discussed potential need for clipping of the strings if she  continues to feel like the strings are protruding with tampon use.  Follow-up as needed  No orders of the defined types were placed in this encounter.  No orders of the defined types were placed in this encounter.    Raliegh Ip, DO [facility] [phone]

## 2022-06-28 ENCOUNTER — Ambulatory Visit: Payer: 59 | Admitting: Family Medicine

## 2022-06-28 ENCOUNTER — Encounter: Payer: Self-pay | Admitting: Family Medicine

## 2022-06-28 DIAGNOSIS — R978 Other abnormal tumor markers: Secondary | ICD-10-CM | POA: Diagnosis not present

## 2022-06-28 DIAGNOSIS — R7303 Prediabetes: Secondary | ICD-10-CM | POA: Diagnosis not present

## 2022-06-28 DIAGNOSIS — Z713 Dietary counseling and surveillance: Secondary | ICD-10-CM

## 2022-06-28 DIAGNOSIS — Z111 Encounter for screening for respiratory tuberculosis: Secondary | ICD-10-CM

## 2022-06-28 MED ORDER — CONTRAVE 8-90 MG PO TB12: ORAL_TABLET | ORAL | 0 refills | Status: DC

## 2022-06-28 NOTE — Progress Notes (Signed)
Subjective:  Patient ID: Donna Walton, female    DOB: [birth year], 22 y.o.   MRN: [id number]  Patient Care Team: Raliegh Ip, DO as PCP - General (Family Medicine) Glyn Ade, PA-C as Physician Assistant (Dermatology)   Chief Complaint:  Weight Loss ([facility] ) and PPD Placement   HPI: Donna Walton is a 22 y.o. female presenting on 06/28/2022 for Weight Loss (Options ) and PPD Placement   Pt presents today to discuss weight management options. She states she has downloaded the My Fitness Pal app and has been walking daily over the last week. She has started following a more healthy diet. She has been unsuccessful with getting to goal BMI and would like to discuss weight loss medications. She has been on Phentermine in the past with results while on medications but then gained it back once she was off of the medications. She states she is committed to diet and exercise but would like help getting to goal. She also needs a PPD for nursing school. No known exposures. No night sweats, weight loss, or coughing up blood reported.       Relevant past medical, surgical, family, and social history reviewed and updated as indicated.  Allergies and medications reviewed and updated. Data reviewed: Chart in Epic.   Past Medical History:  Diagnosis Date   Atypical nevus 05/25/2004   Top Scalp-Slight (Dr. Wyvonnia Lora)   Atypical nevus 09/29/2008   Mid Vertex of Scalp-Moderate (Dr. Wyvonnia Lora)   Atypical nevus 09/20/2012   Left Scalp-Moderate   Atypical nevus-Melanocytic with unusual features 05/25/2004   Left Buttocks, Perirectal (Exc) (Dr. Wyvonnia Lora)    Past Surgical History:  Procedure Laterality Date   TYMPANOSTOMY TUBE PLACEMENT Bilateral 2005   TYMPANOSTOMY TUBE PLACEMENT Bilateral 2004    Social History   Socioeconomic History   Marital status: Single    Spouse name: Not on file   Number of children: Not on file   Years of education: Not on file   Highest education  level: Not on file  Occupational History   Not on file  Tobacco Use   Smoking status: Never   Smokeless tobacco: Never  Vaping Use   Vaping Use: Never used  Substance and Sexual Activity   Alcohol use: No   Drug use: No   Sexual activity: Never  Other Topics Concern   Not on file  Social History Narrative   Not on file   Social Determinants of Health   Financial Resource Strain: Not on file  Food Insecurity: Not on file  Transportation Needs: Not on file  Physical Activity: Not on file  Stress: Not on file  Social Connections: Not on file  Intimate Partner Violence: Not on file    Outpatient Encounter Medications as of 06/28/2022  Medication Sig   Naltrexone-buPROPion HCl ER (CONTRAVE) 8-90 MG TB12 1 tablet Po Qam x7d ; then 1 tab po BID x 7 d; then 2 tab in AM and 1 in PM X7d; Then 2 po BID from then on   Facility-Administered Encounter Medications as of 06/28/2022  Medication   paragard intrauterine copper IUD 1 each    No Known Allergies  Review of Systems  Constitutional:  Negative for activity change, appetite change, chills, diaphoresis, fatigue, fever and unexpected weight change.  HENT: Negative.    Eyes: Negative.  Negative for photophobia and visual disturbance.  Respiratory:  Negative for cough, chest tightness and shortness of breath.   Cardiovascular:  Negative for chest  pain, palpitations and leg swelling.  Gastrointestinal:  Negative for abdominal pain, blood in stool, constipation, diarrhea, nausea and vomiting.  Endocrine: Negative.  Negative for cold intolerance, heat intolerance, polydipsia, polyphagia and polyuria.  Genitourinary:  Negative for decreased urine volume, difficulty urinating, dysuria, frequency and urgency.  Musculoskeletal:  Negative for arthralgias and myalgias.  Skin: Negative.   Allergic/Immunologic: Negative.   Neurological:  Negative for dizziness, tremors, seizures, syncope, facial asymmetry, speech difficulty, weakness,  light-headedness, numbness and headaches.  Hematological: Negative.   Psychiatric/Behavioral:  Negative for confusion, hallucinations, sleep disturbance and suicidal ideas.   All other systems reviewed and are negative.       Objective:  BP 112/71   Pulse 72   Temp 97.7 F (36.5 C) (Temporal)   Ht 5\' 2"  (1.575 m)   Wt 229 lb 3.2 oz (104 kg)   LMP 06/02/2022 Comment: end date  SpO2 97%   BMI 41.92 kg/m    Wt Readings from Last 3 Encounters:  06/28/22 229 lb 3.2 oz (104 kg)  06/02/22 220 lb (99.8 kg)  03/04/22 223 lb 9.6 oz (101.4 kg)    Physical Exam Vitals and nursing note reviewed.  Constitutional:      General: She is not in acute distress.    Appearance: Normal appearance. She is well-developed and well-groomed. She is not ill-appearing, toxic-appearing or diaphoretic.  HENT:     Head: Normocephalic and atraumatic.     Jaw: There is normal jaw occlusion.     Right Ear: Hearing normal.     Left Ear: Hearing normal.     Nose: Nose normal.     Mouth/Throat:     Lips: Pink.     Mouth: Mucous membranes are moist.     Pharynx: Oropharynx is clear. Uvula midline.  Eyes:     General: Lids are normal.     Extraocular Movements: Extraocular movements intact.     Conjunctiva/sclera: Conjunctivae normal.     Pupils: Pupils are equal, round, and reactive to light.  Neck:     Trachea: Trachea and phonation normal.  Cardiovascular:     Rate and Rhythm: Normal rate and regular rhythm.     Chest Wall: PMI is not displaced.     Pulses: Normal pulses.     Heart sounds: Normal heart sounds. No murmur heard.    No friction rub. No gallop.  Pulmonary:     Effort: Pulmonary effort is normal. No respiratory distress.     Breath sounds: Normal breath sounds. No wheezing.  Abdominal:     General: Bowel sounds are normal. There is no abdominal bruit.     Palpations: Abdomen is soft. There is no hepatomegaly or splenomegaly.  Musculoskeletal:        General: Normal range of  motion.     Cervical back: Normal range of motion and neck supple.     Right lower leg: No edema.     Left lower leg: No edema.  Skin:    General: Skin is warm and dry.     Capillary Refill: Capillary refill takes less than 2 seconds.     Coloration: Skin is not cyanotic, jaundiced or pale.     Findings: No rash.  Neurological:     General: No focal deficit present.     Mental Status: She is alert and oriented to person, place, and time.     Sensory: Sensation is intact.     Motor: Motor function is intact.  Coordination: Coordination is intact.     Gait: Gait is intact.     Deep Tendon Reflexes: Reflexes are normal and symmetric.  Psychiatric:        Attention and Perception: Attention and perception normal.        Mood and Affect: Mood and affect normal.        Speech: Speech normal.        Behavior: Behavior normal. Behavior is cooperative.        Thought Content: Thought content normal.        Cognition and Memory: Cognition and memory normal.        Judgment: Judgment normal.     Results for orders placed or performed in visit on 03/04/22  CMP14+EGFR  Result Value Ref Range   Glucose 102 (H) 70 - 99 mg/dL   BUN 17 6 - 20 mg/dL   Creatinine, Ser 6.57 0.57 - 1.00 mg/dL   eGFR 95 >84 ON/GEX/5.28   BUN/Creatinine Ratio 19 9 - 23   Sodium 143 134 - 144 mmol/L   Potassium 3.9 3.5 - 5.2 mmol/L   Chloride 103 96 - 106 mmol/L   CO2 23 20 - 29 mmol/L   Calcium 9.5 8.7 - 10.2 mg/dL   Total Protein 6.8 6.0 - 8.5 g/dL   Albumin 4.4 4.0 - 5.0 g/dL   Globulin, Total 2.4 1.5 - 4.5 g/dL   Albumin/Globulin Ratio 1.8 1.2 - 2.2   Bilirubin Total 0.2 0.0 - 1.2 mg/dL   Alkaline Phosphatase 129 (H) 42 - 106 IU/L   AST 16 0 - 40 IU/L   ALT 17 0 - 32 IU/L  Lipid panel  Result Value Ref Range   Cholesterol, Total 167 100 - 199 mg/dL   Triglycerides 413 0 - 149 mg/dL   HDL 44 >24 mg/dL   VLDL Cholesterol Cal 20 5 - 40 mg/dL   LDL Chol Calc (NIH) 401 (H) 0 - 99 mg/dL   Chol/HDL  Ratio 3.8 0.0 - 4.4 ratio  Thyroid Panel With TSH  Result Value Ref Range   TSH 1.780 0.450 - 4.500 uIU/mL   T4, Total 7.3 4.5 - 12.0 ug/dL   T3 Uptake Ratio 25 24 - 39 %   Free Thyroxine Index 1.8 1.2 - 4.9  CBC with Differential/Platelet  Result Value Ref Range   WBC 8.5 3.4 - 10.8 x10E3/uL   RBC 4.60 3.77 - 5.28 x10E6/uL   Hemoglobin 12.7 11.1 - 15.9 g/dL   Hematocrit 02.7 25.3 - 46.6 %   MCV 83 79 - 97 fL   MCH 27.6 26.6 - 33.0 pg   MCHC 33.4 31.5 - 35.7 g/dL   RDW 66.4 40.3 - 47.4 %   Platelets 372 150 - 450 x10E3/uL   Neutrophils 64 Not Estab. %   Lymphs 27 Not Estab. %   Monocytes 6 Not Estab. %   Eos 1 Not Estab. %   Basos 1 Not Estab. %   Neutrophils Absolute 5.5 1.4 - 7.0 x10E3/uL   Lymphocytes Absolute 2.3 0.7 - 3.1 x10E3/uL   Monocytes Absolute 0.5 0.1 - 0.9 x10E3/uL   EOS (ABSOLUTE) 0.1 0.0 - 0.4 x10E3/uL   Basophils Absolute 0.0 0.0 - 0.2 x10E3/uL   Immature Granulocytes 1 Not Estab. %   Immature Grans (Abs) 0.0 0.0 - 0.1 x10E3/uL  Hgb A1c w/o eAG  Result Value Ref Range   Hgb A1c MFr Bld 5.7 (H) 4.8 - 5.6 %  Specimen status report  Result Value  Ref Range   specimen status report Comment        Pertinent labs & imaging results that were available during my care of the patient were reviewed by me and considered in my medical decision making.  Assessment & Plan:  Eevee was seen today for weight loss and ppd placement.  Diagnoses and all orders for this visit:  Morbid obesity ([facility]) Encounter for weight loss counseling Has been dieting and exercising without reaching goal weight. Will trial below to help with weight loss measures. Labs as below. Follow up in 6 weeks after starting medications.  -     Thyroid Panel With TSH -     CBC with Differential/Platelet -     CMP14+EGFR -     Naltrexone-buPROPion HCl ER (CONTRAVE) 8-90 MG TB12; 1 tablet Po Qam x7d ; then 1 tab po BID x 7 d; then 2 tab in AM and 1 in PM X7d; Then 2 po BID from then  on  Prediabetes Diet and exercise encouraged.  -     Thyroid Panel With TSH -     CBC with Differential/Platelet -     CMP14+EGFR  Screening-pulmonary TB -     TB Skin Test     Continue all other maintenance medications.  Follow up plan: Return in about 6 weeks (around 08/09/2022) for BMI.   Continue healthy lifestyle choices, including diet (rich in fruits, vegetables, and lean proteins, and low in salt and simple carbohydrates) and exercise (at least 30 minutes of moderate physical activity daily).  Educational handout given for Contrave  The above assessment and management plan was discussed with the patient. The patient verbalized understanding of and has agreed to the management plan. Patient is aware to call the clinic if they develop any new symptoms or if symptoms persist or worsen. Patient is aware when to return to the clinic for a follow-up visit. Patient educated on when it is appropriate to go to the emergency department.   Kari Baars, FNP-C [facility] [phone]

## 2022-06-29 LAB — CMP14+EGFR
ALT: 14 IU/L (ref 0–32)
AST: 17 IU/L (ref 0–40)
Albumin/Globulin Ratio: 1.5 (ref 1.2–2.2)
Albumin: 4 g/dL (ref 4.0–5.0)
Alkaline Phosphatase: 124 IU/L — ABNORMAL HIGH (ref 42–106)
BUN/Creatinine Ratio: 21 (ref 9–23)
BUN: 16 mg/dL (ref 6–20)
Bilirubin Total: 0.3 mg/dL (ref 0.0–1.2)
CO2: 21 mmol/L (ref 20–29)
Calcium: 9.1 mg/dL (ref 8.7–10.2)
Chloride: 102 mmol/L (ref 96–106)
Creatinine, Ser: 0.75 mg/dL (ref 0.57–1.00)
Globulin, Total: 2.6 g/dL (ref 1.5–4.5)
Glucose: 97 mg/dL (ref 70–99)
Potassium: 4 mmol/L (ref 3.5–5.2)
Sodium: 139 mmol/L (ref 134–144)
Total Protein: 6.6 g/dL (ref 6.0–8.5)
eGFR: 117 mL/min/{1.73_m2} (ref >59)

## 2022-06-29 LAB — CBC WITH DIFFERENTIAL/PLATELET
Basophils Absolute: 0 10*3/uL (ref 0.0–0.2)
Basos: 1 %
EOS (ABSOLUTE): 0.1 10*3/uL (ref 0.0–0.4)
Eos: 2 %
Hematocrit: 37.8 % (ref 34.0–46.6)
Hemoglobin: 12.3 g/dL (ref 11.1–15.9)
Immature Grans (Abs): 0 10*3/uL (ref 0.0–0.1)
Immature Granulocytes: 0 %
Lymphocytes Absolute: 2.1 10*3/uL (ref 0.7–3.1)
Lymphs: 26 %
MCH: 27.3 pg (ref 26.6–33.0)
MCHC: 32.5 g/dL (ref 31.5–35.7)
MCV: 84 fL (ref 79–97)
Monocytes Absolute: 0.5 10*3/uL (ref 0.1–0.9)
Monocytes: 6 %
Neutrophils Absolute: 5.4 10*3/uL (ref 1.4–7.0)
Neutrophils: 65 %
Platelets: 394 10*3/uL (ref 150–450)
RBC: 4.51 x10E6/uL (ref 3.77–5.28)
RDW: 13.3 % (ref 11.7–15.4)
WBC: 8.3 10*3/uL (ref 3.4–10.8)

## 2022-06-29 LAB — THYROID PANEL WITH TSH
Free Thyroxine Index: 1.5 (ref 1.2–4.9)
T3 Uptake Ratio: 24 % (ref 24–39)
T4, Total: 6.4 ug/dL (ref 4.5–12.0)
TSH: 1.78 u[IU]/mL (ref 0.450–4.500)

## 2022-06-30 ENCOUNTER — Ambulatory Visit: Payer: 59

## 2022-06-30 LAB — TB SKIN TEST
Induration: 0 mm
TB Skin Test: NEGATIVE

## 2022-07-06 LAB — ALKALINE PHOSPHATASE, ISOENZYMES
Alkaline Phosphatase: 123 IU/L — ABNORMAL HIGH (ref 42–106)
BONE FRACTION: 44 % (ref 14–68)
INTESTINAL FRAC.: 0 % (ref 0–18)
LIVER FRACTION: 56 % (ref 18–85)

## 2022-07-06 LAB — SPECIMEN STATUS REPORT

## 2022-07-07 ENCOUNTER — Encounter: Payer: Self-pay | Admitting: Family Medicine

## 2022-07-08 ENCOUNTER — Telehealth: Payer: Self-pay

## 2022-07-08 NOTE — Telephone Encounter (Signed)
Donna Walton ([name]) PA Case ID #: [id number] Rx #: [id number] Need Help? Call us at [phone] Status sent iconSent to Plan today Drug Contrave 8-90MG  er tablets ePA cloud logo Form [employer] ePA Form 2017 NCPDP

## 2022-07-15 NOTE — Telephone Encounter (Signed)
Elio Forget ([name]) PA Case ID #: [id number] Rx #: [id number] Need Help? Call us at [phone] Outcome Approved on May 21 The request has been approved. The authorization is effective from 08/12/2022 to 11/10/2022, as long as the member is enrolled in their current health plan. Authorization Expiration Date: 11/09/2022 Drug Contrave 8-90MG  er tablets ePA cloud logo Form  Med[employer] ePA Form 2017 NCPDP  Pharmacy informed

## 2022-07-27 ENCOUNTER — Encounter: Payer: Self-pay | Admitting: Family Medicine

## 2022-07-28 ENCOUNTER — Other Ambulatory Visit (HOSPITAL_COMMUNITY): Payer: Self-pay

## 2022-07-29 ENCOUNTER — Other Ambulatory Visit (HOSPITAL_COMMUNITY): Payer: Self-pay

## 2022-08-08 ENCOUNTER — Other Ambulatory Visit (HOSPITAL_COMMUNITY): Payer: Self-pay

## 2022-08-18 ENCOUNTER — Ambulatory Visit (INDEPENDENT_AMBULATORY_CARE_PROVIDER_SITE_OTHER): Payer: 59

## 2022-08-18 DIAGNOSIS — Z23 Encounter for immunization: Secondary | ICD-10-CM | POA: Diagnosis not present

## 2022-08-18 NOTE — Progress Notes (Signed)
Boostrix vaccine given to left deltoid.  Patient tolerated well.

## 2022-09-07 ENCOUNTER — Other Ambulatory Visit: Payer: Self-pay | Admitting: Family Medicine

## 2022-09-07 DIAGNOSIS — Z713 Dietary counseling and surveillance: Secondary | ICD-10-CM

## 2022-09-07 DIAGNOSIS — R7303 Prediabetes: Secondary | ICD-10-CM

## 2022-09-07 NOTE — Telephone Encounter (Signed)
She was advised to follow up 6 weeks after initiation. I see no appt made.  She needs OV to document appropriate weight loss following initiation.  Otherwise, insurance will not continue to approve.

## 2022-09-07 NOTE — Telephone Encounter (Signed)
Ok to schedule virtually but I need BP/ weight/ height in preparation for that appt.

## 2022-09-14 ENCOUNTER — Ambulatory Visit: Payer: 59 | Admitting: Family Medicine

## 2022-09-29 ENCOUNTER — Telehealth (INDEPENDENT_AMBULATORY_CARE_PROVIDER_SITE_OTHER): Payer: 59 | Admitting: Family Medicine

## 2022-09-29 ENCOUNTER — Encounter: Payer: Self-pay | Admitting: Family Medicine

## 2022-09-29 DIAGNOSIS — Z6838 Body mass index (BMI) 38.0-38.9, adult: Secondary | ICD-10-CM | POA: Diagnosis not present

## 2022-09-29 DIAGNOSIS — Z713 Dietary counseling and surveillance: Secondary | ICD-10-CM

## 2022-09-29 MED ORDER — CONTRAVE 8-90 MG PO TB12
2.0000 | ORAL_TABLET | Freq: Two times a day (BID) | ORAL | 2 refills | Status: DC
Start: 2022-09-29 — End: 2022-12-13
  Filled 2022-10-07: qty 120, 30d supply, fill #0
  Filled 2022-11-11: qty 120, 30d supply, fill #1

## 2022-09-29 NOTE — Patient Instructions (Signed)
1130

## 2022-09-29 NOTE — Progress Notes (Signed)
   Virtual Visit via Video   I connected with patient on 09/29/22 at 1130 by a video enabled telemedicine application and verified that I am speaking with the correct person using two identifiers.  Location patient: Home Location provider: [facility] Persons participating in the virtual visit: Patient and Provider  I discussed the limitations of evaluation and management by telemedicine and the availability of in person appointments. The patient expressed understanding and agreed to proceed.  Subjective:   HPI:  Pt presents today for  Chief Complaint  Patient presents with   Weight Loss   Presents for Contrave refill. She has been doing well on medications and is down 15 lbs since initiation of medications. States her binge eating has subsided and she has changed her diet and exercise habits.   Review of Systems  Constitutional:  Positive for weight loss (due to diet, exercise, and medications).  All other systems reviewed and are negative.    Patient Active Problem List   Diagnosis Date Noted   Prediabetes 06/28/2022   Morbid obesity (HCC) 03/04/2022    Social History   Tobacco Use   Smoking status: Never   Smokeless tobacco: Never  Substance Use Topics   Alcohol use: No    Current Outpatient Medications:    Naltrexone-buPROPion HCl ER (CONTRAVE) 8-90 MG TB12, Take 2 tablets by mouth 2 (two) times daily., Disp: 120 tablet, Rfl: 2  Current Facility-Administered Medications:    paragard intrauterine copper IUD 1 each, 1 each, Intrauterine, Once, Dettinger, Elige Radon, MD  No Known Allergies  Objective:   Wt 213 lb (96.6 kg)   BMI 38.96 kg/m   Patient is well-developed, well-nourished in no acute distress.  Resting comfortably at home.  Head is normocephalic, atraumatic.  No labored breathing.  Speech is clear and coherent with logical content.  Patient is alert and oriented at baseline.    Assessment and Plan:   Luthien was  seen today for weight loss.  Diagnoses and all orders for this visit:  Morbid obesity (HCC) Encounter for weight loss counseling Doing well on below, continue.  -     Naltrexone-buPROPion HCl ER (CONTRAVE) 8-90 MG TB12; Take 2 tablets by mouth 2 (two) times daily.      Return in about 6 weeks (around 11/10/2022) for BMI and labs.  Kari Baars, FNP-C [facility] [address] [place], [place] [address] [phone]  09/29/2022  Time spent with the patient: 15 minutes, of which >50% was spent in obtaining information about symptoms, reviewing previous labs, evaluations, and treatments, counseling about condition (please see the discussed topics above), and developing a plan to further investigate it; had a number of questions which I addressed.

## 2022-10-06 ENCOUNTER — Encounter: Payer: Self-pay | Admitting: Family Medicine

## 2022-10-07 ENCOUNTER — Other Ambulatory Visit (HOSPITAL_COMMUNITY): Payer: Self-pay

## 2022-10-07 ENCOUNTER — Other Ambulatory Visit: Payer: Self-pay

## 2022-10-07 ENCOUNTER — Telehealth: Payer: Self-pay

## 2022-10-07 NOTE — Telephone Encounter (Signed)
Pharmacy Patient Advocate Encounter  Received notification from [facility] that Prior Authorization for Contrave 8-90MG  er tablets has been CANCELLED due to Member has an active PA on file which is expiring on 11/10/2022. Called pharmacy, no PA needed, RX must be filled at a [facility] pharmacy. Ran test claim, copay  is $304.75.   Key: [id number]

## 2022-10-10 ENCOUNTER — Other Ambulatory Visit (HOSPITAL_COMMUNITY): Payer: Self-pay

## 2022-11-12 ENCOUNTER — Other Ambulatory Visit (HOSPITAL_COMMUNITY): Payer: Self-pay

## 2022-12-13 ENCOUNTER — Encounter: Payer: Self-pay | Admitting: Family Medicine

## 2022-12-13 ENCOUNTER — Ambulatory Visit: Payer: 59 | Admitting: Family Medicine

## 2022-12-13 DIAGNOSIS — R7989 Other specified abnormal findings of blood chemistry: Secondary | ICD-10-CM | POA: Diagnosis not present

## 2022-12-13 DIAGNOSIS — R7303 Prediabetes: Secondary | ICD-10-CM | POA: Diagnosis not present

## 2022-12-13 DIAGNOSIS — F419 Anxiety disorder, unspecified: Secondary | ICD-10-CM

## 2022-12-13 DIAGNOSIS — Z6841 Body Mass Index (BMI) 40.0 and over, adult: Secondary | ICD-10-CM | POA: Diagnosis not present

## 2022-12-13 LAB — BAYER DCA HB A1C WAIVED: HB A1C (BAYER DCA - WAIVED): 5.2 % (ref 4.8–5.6)

## 2022-12-13 NOTE — Progress Notes (Signed)
Subjective:  Patient ID: Donna Walton, female    DOB: 03-Dec-2000, 22 y.o.   MRN: [id number]  Patient Care Team: Sonny Masters, FNP as PCP - General (Family Medicine) Glyn Ade, PA-C as Physician Assistant (Dermatology)   Chief Complaint:  Establish Care and Weight Loss   HPI: Donna Walton is a 22 y.o. female presenting on 12/13/2022 for Establish Care and Weight Loss   Discussed the use of AI scribe software for clinical note transcription with the patient, who gave verbal consent to proceed.  History of Present Illness   The patient, Donna Walton, with a history of weight management issues, was previously on Contrave. Initially, the only side effect reported was constipation. However, three weeks prior to the consultation, the patient began experiencing tremors, severe enough to affect handwriting. The patient initially attributed these tremors to the consumption of energy drinks, but discontinuation of these drinks did not alleviate the symptoms. The patient has since stopped taking Contrave and reports no further tremors.  Despite discontinuation of Contrave, the patient has been able to maintain weight, with a noted decrease in BMI. The patient attributes this to a change in eating habits, focusing on protein intake and reducing fatty foods, as well as maintaining an exercise regimen.  Interestingly, the patient also reports an emotional change while on Contrave. The patient experienced a decrease in anxiety and emotional instability, which has since returned after discontinuation of the medication. The patient describes episodes of crying and heightened anxiety, particularly at night. The patient has no prior history of treatment for anxiety or depression.  The patient is currently finishing nursing school, which may contribute to the reported stress and emotional instability. The patient is due to graduate in just over a month and has a job interview lined up.         Relevant past medical, surgical, family, and social history reviewed and updated as indicated.  Allergies and medications reviewed and updated. Data reviewed: Chart in Epic.   Past Medical History:  Diagnosis Date   Atypical nevus 05/25/2004   Top Scalp-Slight (Dr. Wyvonnia Lora)   Atypical nevus 09/29/2008   Mid Vertex of Scalp-Moderate (Dr. Wyvonnia Lora)   Atypical nevus 09/20/2012   Left Scalp-Moderate   Atypical nevus-Melanocytic with unusual features 05/25/2004   Left Buttocks, Perirectal (Exc) (Dr. Wyvonnia Lora)    Past Surgical History:  Procedure Laterality Date   TYMPANOSTOMY TUBE PLACEMENT Bilateral 2005   TYMPANOSTOMY TUBE PLACEMENT Bilateral 2004    Social History   Socioeconomic History   Marital status: Single    Spouse name: Not on file   Number of children: Not on file   Years of education: Not on file   Highest education level: Not on file  Occupational History   Not on file  Tobacco Use   Smoking status: Never   Smokeless tobacco: Never  Vaping Use   Vaping status: Never Used  Substance and Sexual Activity   Alcohol use: No   Drug use: No   Sexual activity: Never  Other Topics Concern   Not on file  Social History Narrative   Not on file   Social Determinants of Health   Financial Resource Strain: Not on file  Food Insecurity: Not on file  Transportation Needs: Not on file  Physical Activity: Not on file  Stress: Not on file  Social Connections: Not on file  Intimate Partner Violence: Not on file    Outpatient Encounter Medications as of 12/13/2022  Medication Sig   [  DISCONTINUED] Naltrexone-buPROPion HCl ER (CONTRAVE) 8-90 MG TB12 Take 2 tablets by mouth 2 (two) times daily. (Patient not taking: Reported on 12/13/2022)   Facility-Administered Encounter Medications as of 12/13/2022  Medication   paragard intrauterine copper IUD 1 each    No Known Allergies  Pertinent ROS per HPI, otherwise unremarkable      Objective:  BP 128/81    Pulse 76   Temp 97.7 F (36.5 C) (Temporal)   Ht 5\' 2"  (1.575 m)   Wt 222 lb (100.7 kg)   LMP 12/04/2022   SpO2 99%   BMI 40.60 kg/m    Wt Readings from Last 3 Encounters:  12/13/22 222 lb (100.7 kg)  09/29/22 213 lb (96.6 kg)  06/28/22 229 lb 3.2 oz (104 kg)    Physical Exam Vitals and nursing note reviewed.  Constitutional:      General: She is not in acute distress.    Appearance: Normal appearance. She is obese. She is not ill-appearing, toxic-appearing or diaphoretic.  HENT:     Head: Normocephalic and atraumatic.     Nose: Nose normal.     Mouth/Throat:     Mouth: Mucous membranes are moist.  Eyes:     Conjunctiva/sclera: Conjunctivae normal.     Pupils: Pupils are equal, round, and reactive to light.  Cardiovascular:     Rate and Rhythm: Normal rate and regular rhythm.     Heart sounds: Normal heart sounds.  Pulmonary:     Effort: Pulmonary effort is normal.     Breath sounds: Normal breath sounds.  Musculoskeletal:     Right lower leg: No edema.     Left lower leg: No edema.  Skin:    General: Skin is warm and dry.     Capillary Refill: Capillary refill takes less than 2 seconds.  Neurological:     General: No focal deficit present.     Mental Status: She is alert and oriented to person, place, and time.  Psychiatric:        Mood and Affect: Mood normal.        Behavior: Behavior normal.        Thought Content: Thought content normal.        Judgment: Judgment normal.    Physical Exam   MEASUREMENTS: BMI down two points.        Results for orders placed or performed in visit on 06/28/22  Thyroid Panel With TSH  Result Value Ref Range   TSH 1.780 0.450 - 4.500 uIU/mL   T4, Total 6.4 4.5 - 12.0 ug/dL   T3 Uptake Ratio 24 24 - 39 %   Free Thyroxine Index 1.5 1.2 - 4.9  CBC with Differential/Platelet  Result Value Ref Range   WBC 8.3 3.4 - 10.8 x10E3/uL   RBC 4.51 3.77 - 5.28 x10E6/uL   Hemoglobin 12.3 11.1 - 15.9 g/dL   Hematocrit 43.3 29.5 -  46.6 %   MCV 84 79 - 97 fL   MCH 27.3 26.6 - 33.0 pg   MCHC 32.5 31.5 - 35.7 g/dL   RDW 18.8 41.6 - 60.6 %   Platelets 394 150 - 450 x10E3/uL   Neutrophils 65 Not Estab. %   Lymphs 26 Not Estab. %   Monocytes 6 Not Estab. %   Eos 2 Not Estab. %   Basos 1 Not Estab. %   Neutrophils Absolute 5.4 1.4 - 7.0 x10E3/uL   Lymphocytes Absolute 2.1 0.7 - 3.1 x10E3/uL   Monocytes Absolute 0.5  0.1 - 0.9 x10E3/uL   EOS (ABSOLUTE) 0.1 0.0 - 0.4 x10E3/uL   Basophils Absolute 0.0 0.0 - 0.2 x10E3/uL   Immature Granulocytes 0 Not Estab. %   Immature Grans (Abs) 0.0 0.0 - 0.1 x10E3/uL  CMP14+EGFR  Result Value Ref Range   Glucose 97 70 - 99 mg/dL   BUN 16 6 - 20 mg/dL   Creatinine, Ser 6.44 0.57 - 1.00 mg/dL   eGFR 034 >74 QV/ZDG/3.87   BUN/Creatinine Ratio 21 9 - 23   Sodium 139 134 - 144 mmol/L   Potassium 4.0 3.5 - 5.2 mmol/L   Chloride 102 96 - 106 mmol/L   CO2 21 20 - 29 mmol/L   Calcium 9.1 8.7 - 10.2 mg/dL   Total Protein 6.6 6.0 - 8.5 g/dL   Albumin 4.0 4.0 - 5.0 g/dL   Globulin, Total 2.6 1.5 - 4.5 g/dL   Albumin/Globulin Ratio 1.5 1.2 - 2.2   Bilirubin Total 0.3 0.0 - 1.2 mg/dL   Alkaline Phosphatase 124 (H) 42 - 106 IU/L   AST 17 0 - 40 IU/L   ALT 14 0 - 32 IU/L  Alkaline phosphatase, isoenzymes  Result Value Ref Range   Alkaline Phosphatase 123 (H) 42 - 106 IU/L   LIVER FRACTION 56 18 - 85 %   BONE FRACTION 44 14 - 68 %   INTESTINAL FRAC. 0 0 - 18 %  Specimen status report  Result Value Ref Range   specimen status report Comment   TB Skin Test  Result Value Ref Range   TB Skin Test Negative    Induration 0 mm       Pertinent labs & imaging results that were available during my care of the patient were reviewed by me and considered in my medical decision making.  Assessment & Plan:  Ayerim was seen today for establish care and weight loss.  Diagnoses and all orders for this visit:  Morbid obesity (HCC) -     Amb Ref to Medical Weight Management -     CBC with  Differential/Platelet -     CMP14+EGFR -     Thyroid Panel With TSH -     Lipid panel -     Bayer DCA Hb A1c Waived  Prediabetes -     Amb Ref to Medical Weight Management -     CBC with Differential/Platelet -     CMP14+EGFR -     Thyroid Panel With TSH -     Lipid panel -     Bayer DCA Hb A1c Waived  Anxiety -     CBC with Differential/Platelet -     CMP14+EGFR -     Thyroid Panel With TSH     Assessment and Plan    Tremors   Developed while on Contrave, likely due to the Wellbutrin component. Resolved after discontinuation of the medication.   -Discontinue Contrave due to tremors.   -Refer to Healthy Weight [facility] Management for alternative weight management strategies.    Weight Management   Maintained weight loss after discontinuation of Contrave. Utilizing portion control and exercise.   -Continue current diet and exercise regimen.   -Complete labs today to monitor metabolic parameters.    Anxiety   Reports increased emotional lability and anxiety, particularly at night, which improved while on Contrave. Symptoms have returned since discontinuing the medication.   -Monitor symptoms for the next two weeks to determine if she is experiencing a withdrawal effect from discontinuing Contrave.   -  Consider starting an SSRI such as Zoloft or Prozac if symptoms persist.    General Health Maintenance   -Complete physical exam scheduled for January/February.   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          Continue all other maintenance medications.  Follow up plan: Return in about 3 months (around 03/15/2023), or if symptoms worsen or fail to improve, for CPE.   Continue healthy lifestyle choices, including diet (rich in fruits, vegetables, and lean proteins, and low in salt and simple carbohydrates) and exercise (at least 30 minutes of moderate physical activity daily).  Educational handout  given for calorie counting for weight management   The above assessment and management plan was discussed with the patient. The patient verbalized understanding of and has agreed to the management plan. Patient is aware to call the clinic if they develop any new symptoms or if symptoms persist or worsen. Patient is aware when to return to the clinic for a follow-up visit. Patient educated on when it is appropriate to go to the emergency department.   Kari Baars, FNP-C [facility] [phone]

## 2022-12-14 LAB — CBC WITH DIFFERENTIAL/PLATELET
Basophils Absolute: 0 10*3/uL (ref 0.0–0.2)
Basos: 0 %
EOS (ABSOLUTE): 0.2 10*3/uL (ref 0.0–0.4)
Eos: 2 %
Hematocrit: 39.5 % (ref 34.0–46.6)
Hemoglobin: 12.6 g/dL (ref 11.1–15.9)
Immature Grans (Abs): 0.1 10*3/uL (ref 0.0–0.1)
Immature Granulocytes: 1 %
Lymphocytes Absolute: 2.5 10*3/uL (ref 0.7–3.1)
Lymphs: 27 %
MCH: 27.5 pg (ref 26.6–33.0)
MCHC: 31.9 g/dL (ref 31.5–35.7)
MCV: 86 fL (ref 79–97)
Monocytes Absolute: 0.7 10*3/uL (ref 0.1–0.9)
Monocytes: 7 %
Neutrophils Absolute: 6 10*3/uL (ref 1.4–7.0)
Neutrophils: 63 %
Platelets: 418 10*3/uL (ref 150–450)
RBC: 4.59 x10E6/uL (ref 3.77–5.28)
RDW: 13.1 % (ref 11.7–15.4)
WBC: 9.4 10*3/uL (ref 3.4–10.8)

## 2022-12-14 LAB — CMP14+EGFR
ALT: 17 IU/L (ref 0–32)
AST: 14 IU/L (ref 0–40)
Albumin: 4.3 g/dL (ref 4.0–5.0)
Alkaline Phosphatase: 140 [IU]/L — ABNORMAL HIGH (ref 44–121)
BUN/Creatinine Ratio: 22 (ref 9–23)
BUN: 19 mg/dL (ref 6–20)
Bilirubin Total: 0.2 mg/dL (ref 0.0–1.2)
CO2: 23 mmol/L (ref 20–29)
Calcium: 9.5 mg/dL (ref 8.7–10.2)
Chloride: 102 mmol/L (ref 96–106)
Creatinine, Ser: 0.86 mg/dL (ref 0.57–1.00)
Globulin, Total: 2.9 g/dL (ref 1.5–4.5)
Glucose: 91 mg/dL (ref 70–99)
Potassium: 4.2 mmol/L (ref 3.5–5.2)
Sodium: 140 mmol/L (ref 134–144)
Total Protein: 7.2 g/dL (ref 6.0–8.5)
eGFR: 99 mL/min/{1.73_m2} (ref >59)

## 2022-12-14 LAB — THYROID PANEL WITH TSH
Free Thyroxine Index: 1.8 (ref 1.2–4.9)
T3 Uptake Ratio: 24 % (ref 24–39)
T4, Total: 7.5 ug/dL (ref 4.5–12.0)
TSH: 2.21 u[IU]/mL (ref 0.450–4.500)

## 2022-12-14 LAB — LIPID PANEL
Chol/HDL Ratio: 4.1 ratio (ref 0.0–4.4)
Cholesterol, Total: 189 mg/dL (ref 100–199)
HDL: 46 mg/dL (ref >39)
LDL Chol Calc (NIH): 123 mg/dL — ABNORMAL HIGH (ref 0–99)
Triglycerides: 111 mg/dL (ref 0–149)
VLDL Cholesterol Cal: 20 mg/dL (ref 5–40)

## 2022-12-14 NOTE — Addendum Note (Signed)
Addended by: Sonny Masters on: 12/14/2022 08:09 AM   Modules accepted: Orders

## 2022-12-23 ENCOUNTER — Ambulatory Visit (HOSPITAL_COMMUNITY)
Admission: RE | Admit: 2022-12-23 | Discharge: 2022-12-23 | Disposition: A | Discharge status: Home / Self Care | Payer: 59 | Source: Ambulatory Visit | Attending: Family Medicine | Admitting: Family Medicine

## 2022-12-23 DIAGNOSIS — R7989 Other specified abnormal findings of blood chemistry: Secondary | ICD-10-CM | POA: Diagnosis not present

## 2022-12-23 DIAGNOSIS — R945 Abnormal results of liver function studies: Secondary | ICD-10-CM | POA: Diagnosis not present

## 2023-01-03 ENCOUNTER — Encounter (INDEPENDENT_AMBULATORY_CARE_PROVIDER_SITE_OTHER): Payer: 59 | Admitting: Adult Health

## 2023-01-17 IMAGING — DX DG CHEST 2V
2 series · 2 of 2 positions shown · non-contrast
Comparison: None.

CLINICAL DATA: Cough.

EXAM:
CHEST - 2 VIEW

[chest pa]
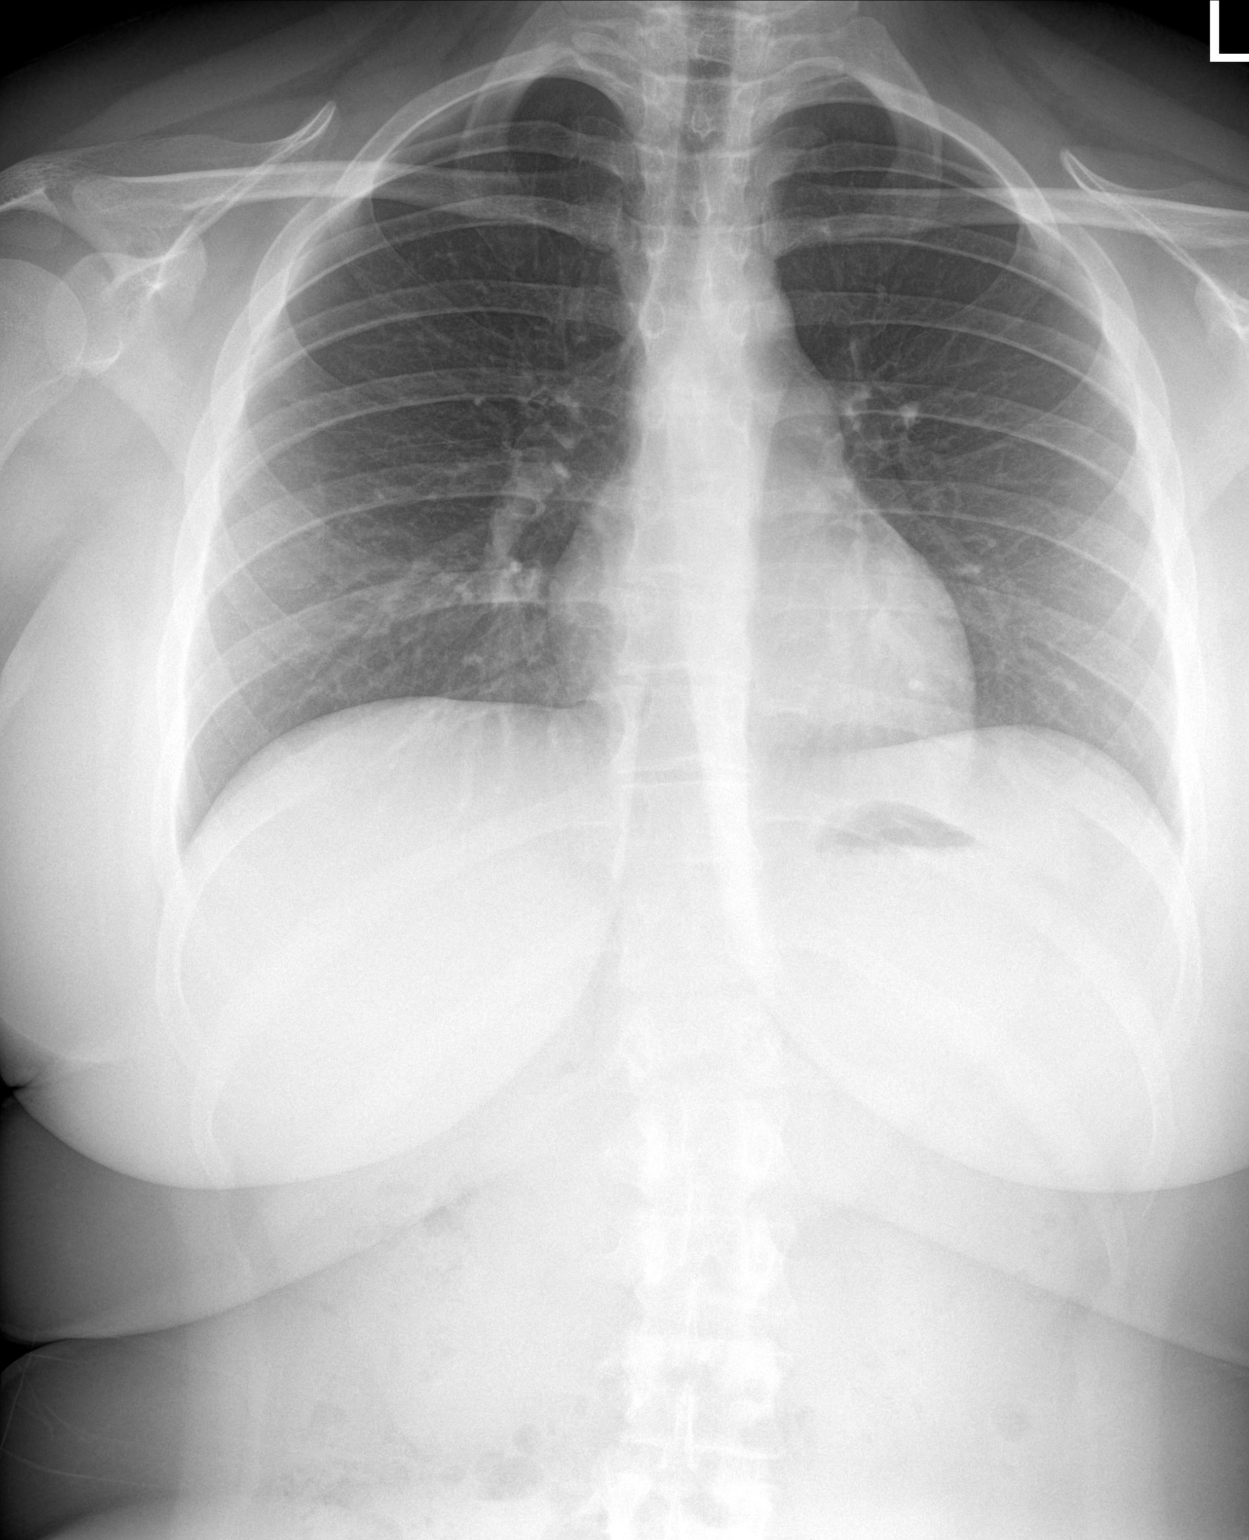

[chest lat]
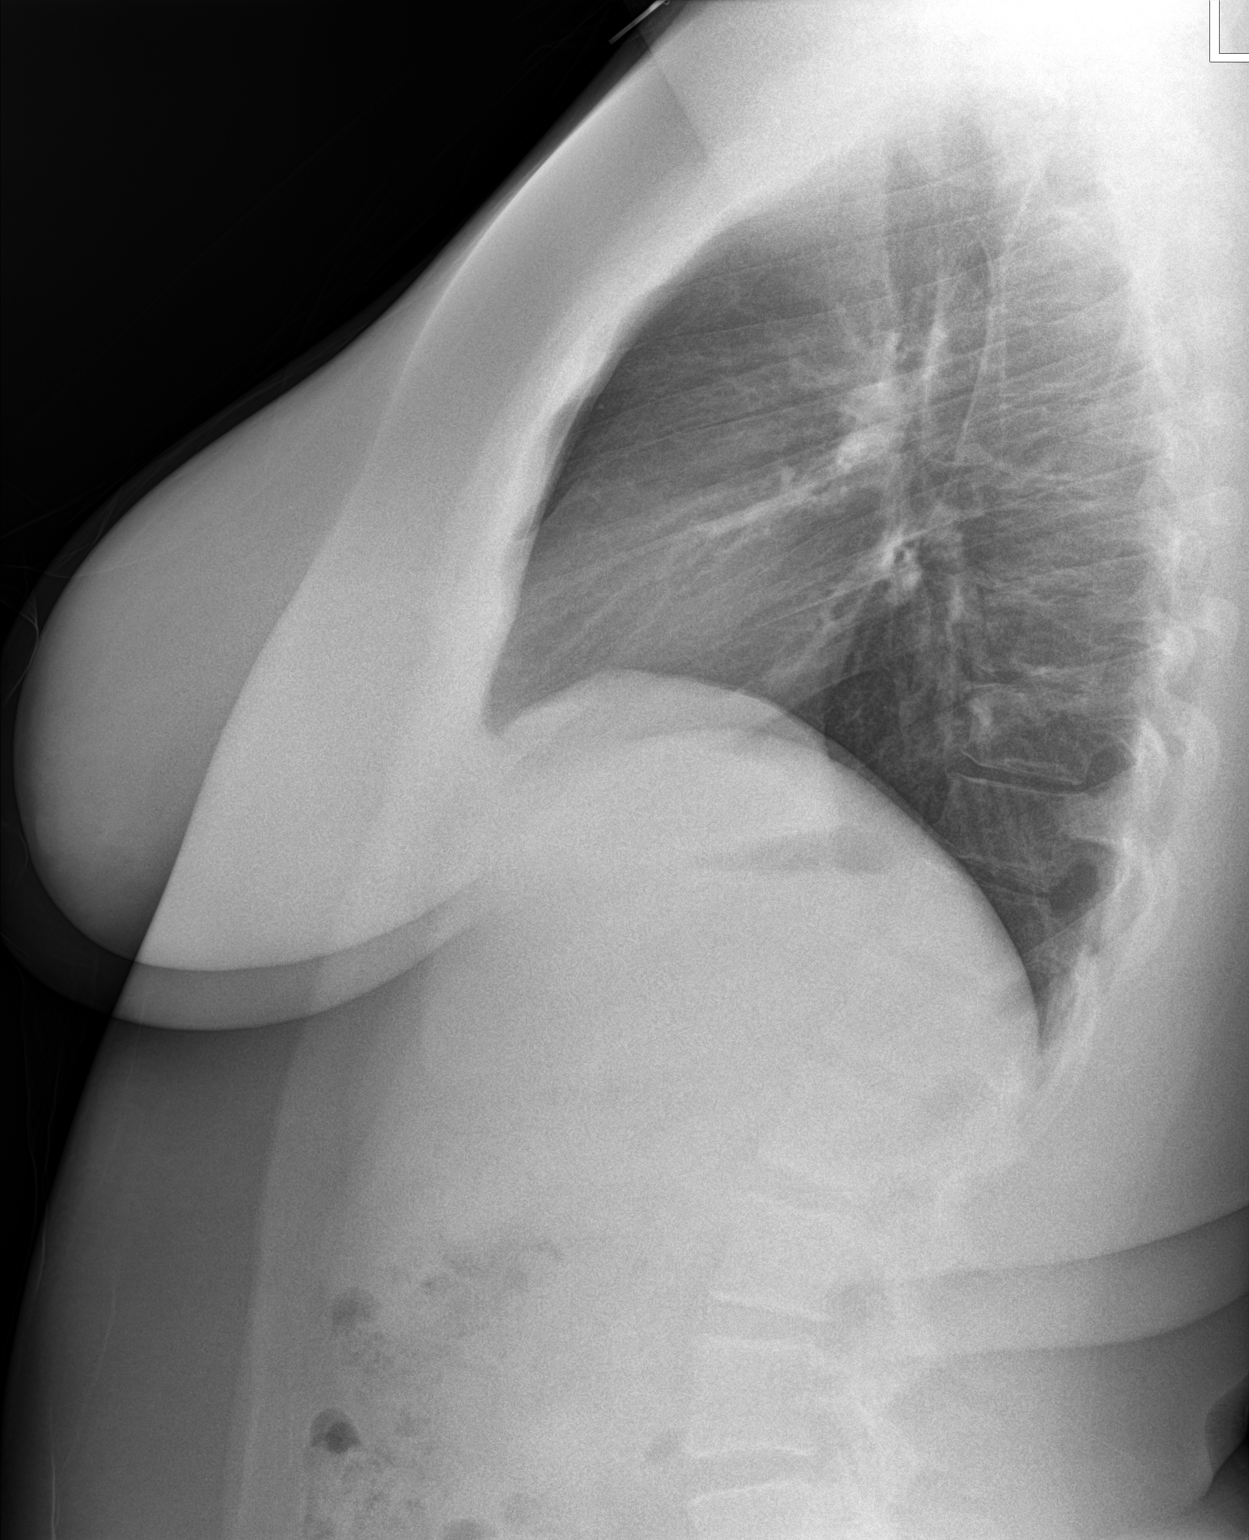

[2 of 2 positions shown; findings below may reference images not displayed]

FINDINGS: The heart size and mediastinal contours are within normal limits.
Both lungs are clear. The visualized skeletal structures are
unremarkable.
IMPRESSION: No active cardiopulmonary disease.

## 2023-04-11 ENCOUNTER — Encounter: Payer: 59 | Admitting: Family Medicine

## 2023-06-20 ENCOUNTER — Ambulatory Visit: Payer: Self-pay | Admitting: Family Medicine

## 2023-06-20 NOTE — Telephone Encounter (Signed)
 Chief Complaint: Tick bite over 1 week ago (last Mon); right side of stomach by belly button  Symptoms: Bite area has spread into a bruise/rash (about 1-2 inches), itching, pain at bite location, not currently hurting, headache but about to start period Pertinent Negatives: Patient denies bulls eye rash, fever  Disposition:  [x] Appointment(In office)  Additional Notes: Patient states when she found the tick it appeared to already be dead. Patient scheduled for an appointment tomorrow morning. This RN educated pt on new-worsening symptoms and when to call back/seek emergent care. Pt verbalized understanding and agrees to plan.    Copied from CRM [id number]. Topic: Clinical - Red Word Triage >> Jun 20, 2023 11:24 AM Everlene Hobby D wrote: Patient has a tick bit that occurred over a week ago that has turned into a bruise or rash and hasn't gone away. It itches and swells up. Reason for Disposition  [1] 2 to 14 days following tick bite AND [2] widespread rash or headache AND [3] no fever  Answer Assessment - Initial Assessment Questions Chief Complaint: Tick bite over 1 week ago (last Mon); right side of stomach by belly button  Tick was flat when removed Not sure type of tick, did not have white dot on back Size of tick: A little bigger than a poppy seed  Symptoms: Bite area has spread into a bruise/rash (about 1-2 inches), itching, pain at bite location, not currently hurting, headache but about to start period  Pertinent Negatives: Patient denies bulls eye rash, fever  Protocols used: Tick Bite-A-AH

## 2023-06-20 NOTE — Telephone Encounter (Signed)
 Appointment scheduled.

## 2023-06-21 ENCOUNTER — Encounter: Payer: Self-pay | Admitting: Family Medicine

## 2023-06-21 ENCOUNTER — Ambulatory Visit (INDEPENDENT_AMBULATORY_CARE_PROVIDER_SITE_OTHER): Admitting: Family Medicine

## 2023-06-21 VITALS — BP 116/75 | HR 76 | Temp 97.5°F | Ht 62.0 in | Wt 225.4 lb

## 2023-06-21 DIAGNOSIS — W57XXXA Bitten or stung by nonvenomous insect and other nonvenomous arthropods, initial encounter: Secondary | ICD-10-CM | POA: Diagnosis not present

## 2023-06-21 DIAGNOSIS — S30861A Insect bite (nonvenomous) of abdominal wall, initial encounter: Secondary | ICD-10-CM | POA: Diagnosis not present

## 2023-06-21 MED ORDER — TRIAMCINOLONE ACETONIDE 0.1 % EX CREA: 1.0000 | TOPICAL_CREAM | Freq: Two times a day (BID) | CUTANEOUS | 0 refills | Status: AC

## 2023-06-21 NOTE — Progress Notes (Signed)
 Subjective:  Patient ID: Donna Walton, female    DOB: [birth year], 23 y.o.   MRN: [id number]  Patient Care Team: Galvin Jules, FNP as PCP - General (Family Medicine) Dorthey Gave, PA-C as Physician Assistant (Dermatology)   Chief Complaint:  Tick Removal (X 1 week ago- still itches and painful )   HPI: Donna Walton is a 23 y.o. female presenting on 06/21/2023 for Tick Removal (X 1 week ago- still itches and painful )  History of Present Illness   Donna Walton is a 23 year old female who presents with concerns about a tick bite reaction.  She experienced a tick bite last Monday. Initially, there were no concerns, but over the past three days, she noticed a change in the appearance of the bite, describing it as 'funny looking'. She is seeking reassurance about the nature of the reaction.  No prior treatment for the tick bite. No bull's eye rash, fever, or flu-like symptoms. She reports a headache, which she attributes to her impending menstrual period.          Relevant past medical, surgical, family, and social history reviewed and updated as indicated.  Allergies and medications reviewed and updated. Data reviewed: Chart in Epic.   Past Medical History:  Diagnosis Date   Atypical nevus 05/25/2004   Top Scalp-Slight (Dr. Vergil Glasser)   Atypical nevus 09/29/2008   Mid Vertex of Scalp-Moderate (Dr. Vergil Glasser)   Atypical nevus 09/20/2012   Left Scalp-Moderate   Atypical nevus-Melanocytic with unusual features 05/25/2004   Left Buttocks, Perirectal (Exc) (Dr. Vergil Glasser)    Past Surgical History:  Procedure Laterality Date   TYMPANOSTOMY TUBE PLACEMENT Bilateral 2005   TYMPANOSTOMY TUBE PLACEMENT Bilateral 2004    Social History   Socioeconomic History   Marital status: Single    Spouse name: Not on file   Number of children: Not on file   Years of education: Not on file   Highest education level: Not on file  Occupational History   Not on file  Tobacco Use    Smoking status: Never   Smokeless tobacco: Never  Vaping Use   Vaping status: Never Used  Substance and Sexual Activity   Alcohol use: No   Drug use: No   Sexual activity: Never  Other Topics Concern   Not on file  Social History Narrative   Not on file   Social Drivers of Health   Financial Resource Strain: Not on file  Food Insecurity: Not on file  Transportation Needs: Not on file  Physical Activity: Not on file  Stress: Not on file  Social Connections: Not on file  Intimate Partner Violence: Not on file    Outpatient Encounter Medications as of 06/21/2023  Medication Sig   triamcinolone cream (KENALOG) 0.1 % Apply 1 Application topically 2 (two) times daily.   Facility-Administered Encounter Medications as of 06/21/2023  Medication   paragard  intrauterine copper  IUD 1 each    No Known Allergies  Pertinent ROS per HPI, otherwise unremarkable      Objective:  BP 116/75   Pulse 76   Temp (!) 97.5 F (36.4 C)   Ht 5\' 2"  (1.575 m)   Wt 225 lb 6.4 oz (102.2 kg)   SpO2 100%   BMI 41.23 kg/m    Wt Readings from Last 3 Encounters:  06/21/23 225 lb 6.4 oz (102.2 kg)  12/13/22 222 lb (100.7 kg)  09/29/22 213 lb (96.6 kg)    Physical Exam Vitals and  nursing note reviewed.  Constitutional:      General: She is not in acute distress.    Appearance: Normal appearance. She is obese. She is not ill-appearing, toxic-appearing or diaphoretic.  HENT:     Head: Normocephalic and atraumatic.     Nose: Nose normal.     Mouth/Throat:     Mouth: Mucous membranes are moist.  Eyes:     Conjunctiva/sclera: Conjunctivae normal.     Pupils: Pupils are equal, round, and reactive to light.  Cardiovascular:     Rate and Rhythm: Normal rate.  Pulmonary:     Effort: Pulmonary effort is normal.  Skin:    General: Skin is warm and dry.     Capillary Refill: Capillary refill takes less than 2 seconds.     Findings: Rash present.       Neurological:     General: No focal  deficit present.     Mental Status: She is alert and oriented to person, place, and time.  Psychiatric:        Mood and Affect: Mood normal.        Behavior: Behavior normal.        Thought Content: Thought content normal.        Judgment: Judgment normal.     Results for orders placed or performed in visit on 12/13/22  Bayer DCA Hb A1c Waived   Collection Time: 12/13/22  9:23 AM  Result Value Ref Range   HB A1C (BAYER DCA - WAIVED) 5.2 4.8 - 5.6 %  CBC with Differential/Platelet   Collection Time: 12/13/22  9:26 AM  Result Value Ref Range   WBC 9.4 3.4 - 10.8 x10E3/uL   RBC 4.59 3.77 - 5.28 x10E6/uL   Hemoglobin 12.6 11.1 - 15.9 g/dL   Hematocrit 96.0 45.4 - 46.6 %   MCV 86 79 - 97 fL   MCH 27.5 26.6 - 33.0 pg   MCHC 31.9 31.5 - 35.7 g/dL   RDW 09.8 11.9 - 14.7 %   Platelets 418 150 - 450 x10E3/uL   Neutrophils 63 Not Estab. %   Lymphs 27 Not Estab. %   Monocytes 7 Not Estab. %   Eos 2 Not Estab. %   Basos 0 Not Estab. %   Neutrophils Absolute 6.0 1.4 - 7.0 x10E3/uL   Lymphocytes Absolute 2.5 0.7 - 3.1 x10E3/uL   Monocytes Absolute 0.7 0.1 - 0.9 x10E3/uL   EOS (ABSOLUTE) 0.2 0.0 - 0.4 x10E3/uL   Basophils Absolute 0.0 0.0 - 0.2 x10E3/uL   Immature Granulocytes 1 Not Estab. %   Immature Grans (Abs) 0.1 0.0 - 0.1 x10E3/uL  CMP14+EGFR   Collection Time: 12/13/22  9:26 AM  Result Value Ref Range   Glucose 91 70 - 99 mg/dL   BUN 19 6 - 20 mg/dL   Creatinine, Ser 8.29 0.57 - 1.00 mg/dL   eGFR 99 >56 OZ/HYQ/6.57   BUN/Creatinine Ratio 22 9 - 23   Sodium 140 134 - 144 mmol/L   Potassium 4.2 3.5 - 5.2 mmol/L   Chloride 102 96 - 106 mmol/L   CO2 23 20 - 29 mmol/L   Calcium 9.5 8.7 - 10.2 mg/dL   Total Protein 7.2 6.0 - 8.5 g/dL   Albumin 4.3 4.0 - 5.0 g/dL   Globulin, Total 2.9 1.5 - 4.5 g/dL   Bilirubin Total 0.2 0.0 - 1.2 mg/dL   Alkaline Phosphatase 140 (H) 44 - 121 IU/L   AST 14 0 - 40 IU/L  ALT 17 0 - 32 IU/L  Thyroid  Panel With TSH   Collection Time: 12/13/22   9:26 AM  Result Value Ref Range   TSH 2.210 0.450 - 4.500 uIU/mL   T4, Total 7.5 4.5 - 12.0 ug/dL   T3 Uptake Ratio 24 24 - 39 %   Free Thyroxine Index 1.8 1.2 - 4.9  Lipid panel   Collection Time: 12/13/22  9:26 AM  Result Value Ref Range   Cholesterol, Total 189 100 - 199 mg/dL   Triglycerides 914 0 - 149 mg/dL   HDL 46 >78 mg/dL   VLDL Cholesterol Cal 20 5 - 40 mg/dL   LDL Chol Calc (NIH) 295 (H) 0 - 99 mg/dL   Chol/HDL Ratio 4.1 0.0 - 4.4 ratio       Pertinent labs & imaging results that were available during my care of the patient were reviewed by me and considered in my medical decision making.  Assessment & Plan:  Donna Walton was seen today for tick removal.  Diagnoses and all orders for this visit:  Tick bite of abdomen, initial encounter -     triamcinolone cream (KENALOG) 0.1 %; Apply 1 Application topically 2 (two) times daily.  Insect bite of abdominal wall with local reaction, initial encounter -     triamcinolone cream (KENALOG) 0.1 %; Apply 1 Application topically 2 (two) times daily.       Local reaction to tick bite Local reaction to a tick bite sustained last Monday. Absence of erythema migrans or systemic symptoms such as fever or flu-like symptoms, ruling out Lyme disease. Headache likely related to impending menstruation. - Prescribe triamcinolone cream for local application to the tick bite area. - Advise monitoring for systemic symptoms such as fever, rash, or flu-like symptoms, which would warrant further evaluation.          Continue all other maintenance medications.  Follow up plan: Return if symptoms worsen or fail to improve.   Continue healthy lifestyle choices, including diet (rich in fruits, vegetables, and lean proteins, and low in salt and simple carbohydrates) and exercise (at least 30 minutes of moderate physical activity daily).  Educational handout given for tick bite  The above assessment and management plan was discussed with the  patient. The patient verbalized understanding of and has agreed to the management plan. Patient is aware to call the clinic if they develop any new symptoms or if symptoms persist or worsen. Patient is aware when to return to the clinic for a follow-up visit. Patient educated on when it is appropriate to go to the emergency department.   Donna Parrot, FNP-C [facility] [phone]

## 2023-07-11 ENCOUNTER — Encounter: Payer: 59 | Admitting: Family Medicine

## 2023-11-14 ENCOUNTER — Encounter: Admitting: Family Medicine

## 2023-12-07 ENCOUNTER — Ambulatory Visit: Payer: Self-pay | Admitting: Family Medicine

## 2023-12-07 ENCOUNTER — Ambulatory Visit: Admitting: Family Medicine

## 2023-12-07 VITALS — BP 130/87 | HR 70 | Temp 97.5°F | Ht 62.0 in | Wt 226.0 lb

## 2023-12-07 DIAGNOSIS — L83 Acanthosis nigricans: Secondary | ICD-10-CM | POA: Diagnosis not present

## 2023-12-07 DIAGNOSIS — N926 Irregular menstruation, unspecified: Secondary | ICD-10-CM

## 2023-12-07 DIAGNOSIS — R7303 Prediabetes: Secondary | ICD-10-CM

## 2023-12-07 LAB — BAYER DCA HB A1C WAIVED: HB A1C (BAYER DCA - WAIVED): 5.3 % (ref 4.8–5.6)

## 2023-12-07 NOTE — Progress Notes (Signed)
 Subjective:  Patient ID: Donna Walton, female    DOB: [birth year], 23 y.o.   MRN: [id number]  Patient Care Team: Severa Rock HERO, FNP as PCP - General (Family Medicine) Porter Andrez SAUNDERS, PA-C (Inactive) as Physician Assistant (Dermatology)   Chief Complaint:  Hip Pain (Bilateral ongoing hip pain but worse 3-4 months ) and Menstrual Problem   HPI: Donna Walton is a 23 y.o. 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 This year, she went from January to October without a period and is currently menstruating. No significant weight fluctuations, but she notes episodes of bloating. She reports darkening of her underarms and between her legs.  She describes experiencing 'really bad, stabbing pains' in her hips, which are not typical cramps. These pains occur at least once a day, last about 30 seconds, and worsen around her menstrual cycle.  She underwent an ultrasound approximately five to six years ago, but does not recall the results. Image results in EHR reveal normal findings on pelvis US .  No unusual hair growth except for occasional hairs. Confirms no possibility of pregnancy and has been taking tests to ensure this. She currently has a copper  IUD in place, which typically does not affect menstrual cycles.          Relevant past medical, surgical, family, and social history reviewed and updated as indicated.  Allergies and medications reviewed and updated. Data reviewed: Chart in Epic.   Past Medical History:  Diagnosis Date   Atypical nevus 05/25/2004   Top Scalp-Slight (Dr. Erlene)   Atypical nevus 09/29/2008   Mid Vertex of Scalp-Moderate (Dr. Erlene)   Atypical nevus 09/20/2012   Left  Scalp-Moderate   Atypical nevus-Melanocytic with unusual features 05/25/2004   Left Buttocks, Perirectal (Exc) (Dr. Erlene)    Past Surgical History:  Procedure Laterality Date   TYMPANOSTOMY TUBE PLACEMENT Bilateral 2005   TYMPANOSTOMY TUBE PLACEMENT Bilateral 2004    Social History   Socioeconomic History   Marital status: Single    Spouse name: Not on file   Number of children: Not on file   Years of education: Not on file   Highest education level: Associate degree: academic program  Occupational History   Not on file  Tobacco Use   Smoking status: Never   Smokeless tobacco: Never  Vaping Use   Vaping status: Never Used  Substance and Sexual Activity   Alcohol use: No   Drug use: No   Sexual activity: Never  Other Topics Concern   Not on file  Social History Narrative   Not on file   Social Drivers of Health   Financial Resource Strain: Low Risk  (12/07/2023)   Overall Financial Resource Strain (CARDIA)    Difficulty of Paying Living Expenses: Not hard at all  Food Insecurity: No Food Insecurity (12/07/2023)   Hunger Vital Sign    Worried About Running Out of Food in the Last Year: Never true    Ran Out of Food in the Last Year: Never true  Transportation Needs: No Transportation Needs (12/07/2023)   PRAPARE - Administrator, Civil Service (Medical): No    Lack of Transportation (Non-Medical): No  Physical Activity: Sufficiently Active (12/07/2023)  Exercise Vital Sign    Days of Exercise per Week: 3 days    Minutes of Exercise per Session: 60 min  Stress: No Stress Concern Present (12/07/2023)   [employer] of Occupational Health - Occupational Stress Questionnaire    Feeling of Stress: Not at all  Social Connections: Moderately Integrated (12/07/2023)   Social Connection and Isolation Panel    Frequency of Communication with Friends and Family: More than three times a week    Frequency of Social Gatherings with Friends and Family:  More than three times a week    Attends Religious Services: More than 4 times per year    Active Member of [employer] or Organizations: Yes    Attends Banker Meetings: More than 4 times per year    Marital Status: Never married  Intimate Partner Violence: Not on file    Outpatient Encounter Medications as of 12/07/2023  Medication Sig   triamcinolone  cream (KENALOG ) 0.1 % Apply 1 Application topically 2 (two) times daily.   Facility-Administered Encounter Medications as of 12/07/2023  Medication   paragard  intrauterine copper  IUD 1 each    No Known Allergies  Pertinent ROS per HPI, otherwise unremarkable      Objective:  BP 130/87   Pulse 70   Temp (!) 97.5 F (36.4 C)   Ht 5' 2 (1.575 m)   Wt 226 lb (102.5 kg)   LMP 12/07/2023   SpO2 100%   BMI 41.34 kg/m    Wt Readings from Last 3 Encounters:  12/07/23 226 lb (102.5 kg)  06/21/23 225 lb 6.4 oz (102.2 kg)  12/13/22 222 lb (100.7 kg)    Physical Exam Vitals and nursing note reviewed.  Constitutional:      General: She is not in acute distress.    Appearance: Normal appearance. She is morbidly obese. She is not ill-appearing, toxic-appearing or diaphoretic.  HENT:     Head: Normocephalic and atraumatic.     Mouth/Throat:     Mouth: Mucous membranes are moist.  Eyes:     Conjunctiva/sclera: Conjunctivae normal.     Pupils: Pupils are equal, round, and reactive to light.  Cardiovascular:     Rate and Rhythm: Normal rate.  Pulmonary:     Effort: Pulmonary effort is normal.  Musculoskeletal:     Right lower leg: No edema.     Left lower leg: No edema.  Skin:    General: Skin is warm and dry.     Capillary Refill: Capillary refill takes less than 2 seconds.  Neurological:     General: No focal deficit present.     Mental Status: She is alert and oriented to person, place, and time.  Psychiatric:        Mood and Affect: Mood normal.        Behavior: Behavior normal.        Thought Content:  Thought content normal.        Judgment: Judgment normal.      Results for orders placed or performed in visit on 12/13/22  Bayer DCA Hb A1c Waived   Collection Time: 12/13/22  9:23 AM  Result Value Ref Range   HB A1C (BAYER DCA - WAIVED) 5.2 4.8 - 5.6 %  CBC with Differential/Platelet   Collection Time: 12/13/22  9:26 AM  Result Value Ref Range   WBC 9.4 3.4 - 10.8 x10E3/uL   RBC 4.59 3.77 - 5.28 x10E6/uL   Hemoglobin 12.6 11.1 - 15.9 g/dL   Hematocrit  39.5 34.0 - 46.6 %   MCV 86 79 - 97 fL   MCH 27.5 26.6 - 33.0 pg   MCHC 31.9 31.5 - 35.7 g/dL   RDW 86.8 88.2 - 84.5 %   Platelets 418 150 - 450 x10E3/uL   Neutrophils 63 Not Estab. %   Lymphs 27 Not Estab. %   Monocytes 7 Not Estab. %   Eos 2 Not Estab. %   Basos 0 Not Estab. %   Neutrophils Absolute 6.0 1.4 - 7.0 x10E3/uL   Lymphocytes Absolute 2.5 0.7 - 3.1 x10E3/uL   Monocytes Absolute 0.7 0.1 - 0.9 x10E3/uL   EOS (ABSOLUTE) 0.2 0.0 - 0.4 x10E3/uL   Basophils Absolute 0.0 0.0 - 0.2 x10E3/uL   Immature Granulocytes 1 Not Estab. %   Immature Grans (Abs) 0.1 0.0 - 0.1 x10E3/uL  CMP14+EGFR   Collection Time: 12/13/22  9:26 AM  Result Value Ref Range   Glucose 91 70 - 99 mg/dL   BUN 19 6 - 20 mg/dL   Creatinine, Ser 9.13 0.57 - 1.00 mg/dL   eGFR 99 >40 fO/fpw/8.26   BUN/Creatinine Ratio 22 9 - 23   Sodium 140 134 - 144 mmol/L   Potassium 4.2 3.5 - 5.2 mmol/L   Chloride 102 96 - 106 mmol/L   CO2 23 20 - 29 mmol/L   Calcium 9.5 8.7 - 10.2 mg/dL   Total Protein 7.2 6.0 - 8.5 g/dL   Albumin 4.3 4.0 - 5.0 g/dL   Globulin, Total 2.9 1.5 - 4.5 g/dL   Bilirubin Total 0.2 0.0 - 1.2 mg/dL   Alkaline Phosphatase 140 (H) 44 - 121 IU/L   AST 14 0 - 40 IU/L   ALT 17 0 - 32 IU/L  Thyroid  Panel With TSH   Collection Time: 12/13/22  9:26 AM  Result Value Ref Range   TSH 2.210 0.450 - 4.500 uIU/mL   T4, Total 7.5 4.5 - 12.0 ug/dL   T3 Uptake Ratio 24 24 - 39 %   Free Thyroxine Index 1.8 1.2 - 4.9  Lipid panel   Collection  Time: 12/13/22  9:26 AM  Result Value Ref Range   Cholesterol, Total 189 100 - 199 mg/dL   Triglycerides 888 0 - 149 mg/dL   HDL 46 >60 mg/dL   VLDL Cholesterol Cal 20 5 - 40 mg/dL   LDL Chol Calc (NIH) 876 (H) 0 - 99 mg/dL   Chol/HDL Ratio 4.1 0.0 - 4.4 ratio       Pertinent labs & imaging results that were available during my care of the patient were reviewed by me and considered in my medical decision making.  Assessment & Plan:  Donna Walton was seen today for hip pain and menstrual problem.  Diagnoses and all orders for this visit:  Irregular menses -     Bayer DCA Hb A1c Waived -     Anemia Profile B -     Hormone Panel -     CMP14+EGFR  Morbid obesity (HCC) -     Bayer DCA Hb A1c Waived -     Anemia Profile B -     Hormone Panel -     CMP14+EGFR  Prediabetes -     Bayer DCA Hb A1c Waived -     Anemia Profile B -     Hormone Panel -     CMP14+EGFR  Acanthosis nigricans -     Bayer DCA Hb A1c Waived -     Anemia Profile B -  Hormone Panel -     CMP14+EGFR     Irregular menstrual cycles and suspected polycystic ovary syndrome (PCOS) Irregular menstrual cycles with amenorrhea for three months, accompanied by stabbing hip pains, darkening of underarms and intergluteal cleft, and occasional hirsutism. Differential diagnosis includes PCOS. Previous ultrasound for polycystic ovaries was conducted five to six years ago. No significant weight fluctuations, but bloating is present. Copper  IUD in place, which should not affect menstrual cycles. Pregnancy ruled out with negative tests. - Ordered hormone profile, thyroid  function tests, A1c, and anemia profile. - If hormone profile or other labs are abnormal, will order ultrasound of ovaries. - Discussed potential treatments for PCOS, including metformin and spironolactone for hair and acne management.          Continue all other maintenance medications.  Follow up plan: Return if symptoms worsen or fail to  improve.   Continue healthy lifestyle choices, including diet (rich in fruits, vegetables, and lean proteins, and low in salt and simple carbohydrates) and exercise (at least 30 minutes of moderate physical activity daily).    The above assessment and management plan was discussed with the patient. The patient verbalized understanding of and has agreed to the management plan. Patient is aware to call the clinic if they develop any new symptoms or if symptoms persist or worsen. Patient is aware when to return to the clinic for a follow-up visit. Patient educated on when it is appropriate to go to the emergency department.   Donna Bruns, FNP-C [facility] [phone]

## 2023-12-22 LAB — ANEMIA PROFILE B
Basophils Absolute: 0 x10E3/uL (ref 0.0–0.2)
Basos: 0 %
EOS (ABSOLUTE): 0.4 x10E3/uL (ref 0.0–0.4)
Eos: 4 %
Ferritin: 90 ng/mL (ref 15–150)
Folate: 3.7 ng/mL (ref >3.0)
Hematocrit: 40.3 % (ref 34.0–46.6)
Hemoglobin: 12.8 g/dL (ref 11.1–15.9)
Immature Grans (Abs): 0.1 x10E3/uL (ref 0.0–0.1)
Immature Granulocytes: 1 %
Iron Saturation: 9 % — CL (ref 15–55)
Iron: 33 ug/dL (ref 27–159)
Lymphocytes Absolute: 3.6 x10E3/uL — ABNORMAL HIGH (ref 0.7–3.1)
Lymphs: 35 %
MCH: 27.4 pg (ref 26.6–33.0)
MCHC: 31.8 g/dL (ref 31.5–35.7)
MCV: 86 fL (ref 79–97)
Monocytes Absolute: 0.8 x10E3/uL (ref 0.1–0.9)
Monocytes: 8 %
Neutrophils Absolute: 5.4 x10E3/uL (ref 1.4–7.0)
Neutrophils: 52 %
Platelets: 440 x10E3/uL (ref 150–450)
RBC: 4.67 x10E6/uL (ref 3.77–5.28)
RDW: 13.5 % (ref 11.7–15.4)
Retic Ct Pct: 1.7 % (ref 0.6–2.6)
Total Iron Binding Capacity: 384 ug/dL (ref 250–450)
UIBC: 351 ug/dL (ref 131–425)
Vitamin B-12: 678 pg/mL (ref 232–1245)
WBC: 10.2 x10E3/uL (ref 3.4–10.8)

## 2023-12-22 LAB — CMP14+EGFR
ALT: 18 IU/L (ref 0–32)
AST: 19 IU/L (ref 0–40)
Albumin: 4.6 g/dL (ref 4.0–5.0)
Alkaline Phosphatase: 137 IU/L — ABNORMAL HIGH (ref 41–116)
BUN/Creatinine Ratio: 21 (ref 9–23)
BUN: 22 mg/dL — ABNORMAL HIGH (ref 6–20)
Bilirubin Total: <0.2 mg/dL (ref 0.0–1.2)
CO2: 22 mmol/L (ref 20–29)
Calcium: 9.9 mg/dL (ref 8.7–10.2)
Chloride: 100 mmol/L (ref 96–106)
Creatinine, Ser: 1.03 mg/dL — ABNORMAL HIGH (ref 0.57–1.00)
Globulin, Total: 3.1 g/dL (ref 1.5–4.5)
Glucose: 81 mg/dL (ref 70–99)
Potassium: 4.3 mmol/L (ref 3.5–5.2)
Sodium: 139 mmol/L (ref 134–144)
Total Protein: 7.7 g/dL (ref 6.0–8.5)
eGFR: 79 mL/min/1.73 (ref >59)

## 2023-12-22 LAB — HORMONE PANEL (T4,TSH,FSH,TESTT,SHBG,DHEA,ETC)
DHEA-Sulfate, LCMS: 214 ug/dL
Estradiol, Serum, MS: 21 pg/mL
Estrone Sulfate: 58 ng/dL
Follicle Stimulating Hormone: 8.9 m[IU]/mL
Free T-3: 3.4 pg/mL
Free Testosterone, Serum: 4 pg/mL
Progesterone, Serum: <10 ng/dL
Sex Hormone Binding Globulin: 32.8 nmol/L
T4: 9.6 ug/dL
TSH: 4.8 uU/mL — ABNORMAL HIGH
Testosterone, Serum (Total): 21 ng/dL
Testosterone-% Free: 1.9 %
Triiodothyronine (T-3), Serum: 144 ng/dL
